# Patient Record
Sex: Male | Born: 1950 | Race: White | Hispanic: No | Marital: Married | Smoking: Never smoker
Health system: Southern US, Community
[De-identification: ages and names within clinical notes are randomized; demographics above are authoritative.]

## PROBLEM LIST (undated history)

## (undated) DIAGNOSIS — G4700 Insomnia, unspecified: Secondary | ICD-10-CM

## (undated) DIAGNOSIS — J189 Pneumonia, unspecified organism: Secondary | ICD-10-CM

## (undated) DIAGNOSIS — G478 Other sleep disorders: Secondary | ICD-10-CM

## (undated) DIAGNOSIS — K589 Irritable bowel syndrome without diarrhea: Secondary | ICD-10-CM

## (undated) DIAGNOSIS — E739 Lactose intolerance, unspecified: Secondary | ICD-10-CM

## (undated) DIAGNOSIS — L738 Other specified follicular disorders: Secondary | ICD-10-CM

## (undated) DIAGNOSIS — R490 Dysphonia: Secondary | ICD-10-CM

## (undated) DIAGNOSIS — L719 Rosacea, unspecified: Secondary | ICD-10-CM

## (undated) DIAGNOSIS — R7302 Impaired glucose tolerance (oral): Secondary | ICD-10-CM

## (undated) DIAGNOSIS — F32A Depression, unspecified: Secondary | ICD-10-CM

## (undated) DIAGNOSIS — E785 Hyperlipidemia, unspecified: Secondary | ICD-10-CM

## (undated) DIAGNOSIS — G4730 Sleep apnea, unspecified: Secondary | ICD-10-CM

## (undated) DIAGNOSIS — G4733 Obstructive sleep apnea (adult) (pediatric): Secondary | ICD-10-CM

## (undated) DIAGNOSIS — L219 Seborrheic dermatitis, unspecified: Secondary | ICD-10-CM

## (undated) DIAGNOSIS — Z86010 Personal history of colonic polyps: Principal | ICD-10-CM

## (undated) DIAGNOSIS — J309 Allergic rhinitis, unspecified: Secondary | ICD-10-CM

## (undated) DIAGNOSIS — T7840XA Allergy, unspecified, initial encounter: Secondary | ICD-10-CM

## (undated) DIAGNOSIS — G43909 Migraine, unspecified, not intractable, without status migrainosus: Secondary | ICD-10-CM

## (undated) DIAGNOSIS — B54 Unspecified malaria: Secondary | ICD-10-CM

## (undated) DIAGNOSIS — Z889 Allergy status to unspecified drugs, medicaments and biological substances status: Secondary | ICD-10-CM

## (undated) HISTORY — DX: Lactose intolerance, unspecified: E73.9

## (undated) HISTORY — DX: Rosacea, unspecified: L71.9

## (undated) HISTORY — DX: Personal history of colonic polyps: Z86.010

## (undated) HISTORY — PX: COLONOSCOPY: SHX174

## (undated) HISTORY — DX: Allergy status to unspecified drugs, medicaments and biological substances: Z88.9

## (undated) HISTORY — DX: Other sleep disorders: G47.8

## (undated) HISTORY — DX: Pneumonia, unspecified organism: J18.9

## (undated) HISTORY — DX: Allergy, unspecified, initial encounter: T78.40XA

## (undated) HISTORY — PX: VOCAL CORD INJECTION: SHX2663

## (undated) HISTORY — DX: Insomnia, unspecified: G47.00

## (undated) HISTORY — DX: Migraine, unspecified, not intractable, without status migrainosus: G43.909

## (undated) HISTORY — DX: Sleep apnea, unspecified: G47.30

## (undated) HISTORY — DX: Dysphonia: R49.0

## (undated) HISTORY — DX: Impaired glucose tolerance (oral): R73.02

## (undated) HISTORY — DX: Depression, unspecified: F32.A

## (undated) HISTORY — DX: Other specified follicular disorders: L73.8

## (undated) HISTORY — DX: Allergic rhinitis, unspecified: J30.9

## (undated) HISTORY — DX: Unspecified malaria: B54

## (undated) HISTORY — DX: Seborrheic dermatitis, unspecified: L21.9

## (undated) HISTORY — DX: Obstructive sleep apnea (adult) (pediatric): G47.33

## (undated) HISTORY — DX: Irritable bowel syndrome without diarrhea: K58.9

## (undated) HISTORY — DX: Hyperlipidemia, unspecified: E78.5

---

## 1972-06-23 HISTORY — PX: WISDOM TOOTH EXTRACTION: SHX21

## 1988-06-23 HISTORY — PX: VASECTOMY: SHX75

## 1997-06-23 DIAGNOSIS — B009 Herpesviral infection, unspecified: Secondary | ICD-10-CM

## 1997-06-23 HISTORY — DX: Herpesviral infection, unspecified: B00.9

## 2004-10-24 ENCOUNTER — Ambulatory Visit: Payer: Self-pay | Admitting: Internal Medicine

## 2005-06-23 DIAGNOSIS — K5790 Diverticulosis of intestine, part unspecified, without perforation or abscess without bleeding: Secondary | ICD-10-CM

## 2005-06-23 HISTORY — DX: Diverticulosis of intestine, part unspecified, without perforation or abscess without bleeding: K57.90

## 2005-08-22 ENCOUNTER — Encounter: Admission: RE | Admit: 2005-08-22 | Discharge: 2005-08-22 | Payer: Self-pay | Admitting: Family Medicine

## 2005-08-22 ENCOUNTER — Ambulatory Visit: Payer: Self-pay | Admitting: Internal Medicine

## 2005-09-05 ENCOUNTER — Ambulatory Visit: Payer: Self-pay | Admitting: Internal Medicine

## 2006-04-22 ENCOUNTER — Ambulatory Visit: Payer: Self-pay | Admitting: Internal Medicine

## 2008-06-23 DIAGNOSIS — H9190 Unspecified hearing loss, unspecified ear: Secondary | ICD-10-CM

## 2008-06-23 HISTORY — DX: Unspecified hearing loss, unspecified ear: H91.90

## 2010-09-11 ENCOUNTER — Encounter: Payer: Self-pay | Admitting: Internal Medicine

## 2010-09-19 NOTE — Letter (Signed)
Summary: Colonoscopy Date Change Letter  [facility]  [address] [place], [place] [address]   Phone: [phone]  Fax: [phone]      September 11, 2010 MRN: [id number]   Jesus Ward [address] [place], [place]  [address]   Dear [name],   Previously you were recommended to have a repeat colonoscopy around this time. Your chart was recently reviewed by Dr. Lina Sar of [facility]. Follow up colonoscopy is now recommended in March 2017. This revised recommendation is based on current, nationally recognized guidelines for colorectal cancer screening and polyp surveillance. These guidelines are endorsed by the American Cancer Society, [employer] on Colorectal Cancer as well as numerous other major medical organizations.  Please understand that our recommendation assumes that you do not have any new symptoms such as bleeding, a change in bowel habits, anemia, or significant abdominal discomfort. If you do have any concerning GI symptoms or want to discuss the guideline recommendations, please call to arrange an office visit at your earliest convenience. Otherwise we will keep you in our reminder system and contact you 1-2 months prior to the date listed above to schedule your next colonoscopy.  Thank you,  Hedwig Morton. Juanda Chance, M.D.  [facility] [phone]

## 2011-01-31 ENCOUNTER — Institutional Professional Consult (permissible substitution): Payer: Self-pay | Admitting: Internal Medicine

## 2011-03-07 ENCOUNTER — Ambulatory Visit (INDEPENDENT_AMBULATORY_CARE_PROVIDER_SITE_OTHER): Payer: BC Managed Care – PPO | Admitting: Internal Medicine

## 2011-03-07 ENCOUNTER — Encounter: Payer: Self-pay | Admitting: Internal Medicine

## 2011-03-07 VITALS — BP 120/80 | HR 68 | Ht 70.0 in | Wt 245.4 lb

## 2011-03-07 DIAGNOSIS — G4733 Obstructive sleep apnea (adult) (pediatric): Secondary | ICD-10-CM

## 2011-03-07 DIAGNOSIS — G4700 Insomnia, unspecified: Secondary | ICD-10-CM

## 2011-03-07 MED ORDER — ESZOPICLONE 2 MG PO TABS: 2.0000 mg | ORAL_TABLET | Freq: Every evening | ORAL | Status: DC | PRN

## 2011-03-07 NOTE — Assessment & Plan Note (Addendum)
Medical issues and treatment options for sleep apnea were reviewed. We discussed the impact of CPAP humidification on his speaking voice. Try advancing pressure to 10. Report if it aggravates dryness or voice issues

## 2011-03-07 NOTE — Patient Instructions (Signed)
Sample and script- try Lunesta 2 mg  At bedtime for sleep as an alternative to [place].   Order- [facility]- change CPAP to 10 cwp  Please call as needed

## 2011-03-07 NOTE — Assessment & Plan Note (Signed)
He is having sleep eating issues that may be the result of partial arousal/ sleep walking on [place].  We will try Lunesta but if the main issue is waking after sleep onset, then ambien CR is an option.

## 2011-03-07 NOTE — Progress Notes (Signed)
Subjective:    Patient ID: Jesus Ward, male    DOB: [birth year], 60 y.o.   MRN: [id number]  HPI 03/07/11- 71 yoM seen in sleep medicine consultation at kind request of Dr Duaine Dredge because of sleep apnea.  He was originally diagnosed in the 1990s with a baseline RDI/AHI of 53 per hour on 03/27/1994. He was fitted with CPAP and I last saw him in this office in 2006. Since then he has been using CPAP at 9 CWP from [facility] with a nasal mask but no humidifier. Uncomfortable dryness actually has triggered this current visit to me because it was interfering with his ability to sleep. 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 In particular he notices difficulty with sleep maintenance. Ambien helped and he has usually fall asleep quickly, but Ambien wears off too early and then he is awake off and on. He reports frequent sleep-eating. Bedtimes between 10 and 11 PM, estimated sleep latency 1-2 minutes,, out of bed once during the night for up at 7 to start his day. He has lost about 10 pounds in the last 2 years. 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  Review of Systems Constitutional:   No-   weight loss, night sweats, fevers, chills,  + fatigue, lassitude. HEENT:   No-  headaches, difficulty swallowing, tooth/dental problems, sore throat,       No-  sneezing, itching, ear ache, +nasal congestion, post nasal drip,  CV:  No-   chest pain, orthopnea, PND, swelling in lower extremities, anasarca,dizziness, palpitations Resp: No-   shortness of breath with exertion or at rest.              No-   productive cough,  No non-productive cough,  No-  coughing up of blood.              No-   change in color of mucus.  No- wheezing.   Skin: No-   rash or lesions. GI:  No-   heartburn, indigestion, abdominal pain, nausea, vomiting, diarrhea,             change in bowel habits, loss of appetite GU: No-   dysuria, change in color of urine, no urgency or frequency.  No- flank pain. MS:  No-   joint pain or swelling.  No- decreased range of motion.  No- back pain. Neuro- grossly normal to observation, Or:  Psych:  No- change in mood or affect. + depression/ anxiety.  No memory loss.      Objective:   Physical Exam General- Alert, Oriented, Affect-appropriate, Distress- none acute, overweight Skin- rash-none, lesions- none, excoriation- none Lymphadenopathy- none Head- atraumatic            Eyes- Gross vision intact, PERRLA, conjunctivae clear secretions            Ears- Hearing, canals-normal            Nose- Clear, no-Septal dev, mucus, polyps, erosion, perforation             Throat- Mallampati II , mucosa clear , drainage- none, tonsils- atrophic Neck- flexible , trachea midline, no stridor , thyroid nl, carotid no bruit Chest - symmetrical excursion , unlabored           Heart/CV- RRR , no murmur , no gallop  , no  rub, nl s1 s2                           - JVD- none , edema- none, stasis changes- none, varices- none           Lung- clear to P&A, wheeze- none, cough- none , dullness-none, rub- none           Chest wall-  Abd- tender-no, distended-no, bowel sounds-present, HSM- no Br/ Gen/ Rectal- Not done, not indicated Extrem- cyanosis- none, clubbing, none, atrophy- none, strength- nl Neuro- grossly intact to observation         Assessment & Plan:

## 2011-03-20 ENCOUNTER — Encounter: Payer: Self-pay | Admitting: Internal Medicine

## 2011-04-28 DIAGNOSIS — R490 Dysphonia: Secondary | ICD-10-CM | POA: Insufficient documentation

## 2011-05-28 DIAGNOSIS — Z8719 Personal history of other diseases of the digestive system: Secondary | ICD-10-CM | POA: Insufficient documentation

## 2011-05-28 DIAGNOSIS — J45909 Unspecified asthma, uncomplicated: Secondary | ICD-10-CM | POA: Insufficient documentation

## 2013-08-16 ENCOUNTER — Ambulatory Visit
Admission: RE | Admit: 2013-08-16 | Discharge: 2013-08-16 | Disposition: A | Discharge status: Home / Self Care | Payer: BC Managed Care – PPO | Source: Ambulatory Visit | Attending: Family Medicine | Admitting: Family Medicine

## 2013-08-16 ENCOUNTER — Other Ambulatory Visit: Payer: Self-pay | Admitting: Family Medicine

## 2013-08-16 DIAGNOSIS — R0781 Pleurodynia: Secondary | ICD-10-CM

## 2013-08-16 DIAGNOSIS — R059 Cough, unspecified: Secondary | ICD-10-CM

## 2013-08-16 DIAGNOSIS — R05 Cough: Secondary | ICD-10-CM

## 2013-09-01 ENCOUNTER — Other Ambulatory Visit: Payer: Self-pay | Admitting: Family Medicine

## 2013-09-01 ENCOUNTER — Ambulatory Visit
Admission: RE | Admit: 2013-09-01 | Discharge: 2013-09-01 | Disposition: A | Discharge status: Home / Self Care | Payer: BC Managed Care – PPO | Source: Ambulatory Visit | Attending: Family Medicine | Admitting: Family Medicine

## 2013-09-01 DIAGNOSIS — Z09 Encounter for follow-up examination after completed treatment for conditions other than malignant neoplasm: Secondary | ICD-10-CM

## 2013-09-27 ENCOUNTER — Ambulatory Visit
Admission: RE | Admit: 2013-09-27 | Discharge: 2013-09-27 | Disposition: A | Discharge status: Home / Self Care | Payer: BC Managed Care – PPO | Source: Ambulatory Visit | Attending: Family Medicine | Admitting: Family Medicine

## 2013-09-27 ENCOUNTER — Other Ambulatory Visit: Payer: Self-pay | Admitting: Family Medicine

## 2013-09-27 DIAGNOSIS — R0989 Other specified symptoms and signs involving the circulatory and respiratory systems: Secondary | ICD-10-CM

## 2013-09-27 DIAGNOSIS — R05 Cough: Secondary | ICD-10-CM

## 2013-09-27 DIAGNOSIS — R059 Cough, unspecified: Secondary | ICD-10-CM

## 2014-01-12 ENCOUNTER — Ambulatory Visit (INDEPENDENT_AMBULATORY_CARE_PROVIDER_SITE_OTHER): Payer: BC Managed Care – PPO | Admitting: Internal Medicine

## 2014-01-12 ENCOUNTER — Encounter: Payer: Self-pay | Admitting: Internal Medicine

## 2014-01-12 VITALS — BP 126/80 | HR 86 | Ht 70.0 in | Wt 241.0 lb

## 2014-01-12 DIAGNOSIS — G4733 Obstructive sleep apnea (adult) (pediatric): Secondary | ICD-10-CM

## 2014-01-12 NOTE — Patient Instructions (Signed)
Order- [facility] autotitrate CPAP 5-20 for pressure/ compliance recommendation     Dx OSA  Please call as needed

## 2014-01-12 NOTE — Progress Notes (Signed)
01/12/14- 62 yoM  FOLLOWS FOR:  Pt last seen 2012-Wearing CPAP 6-8 hours per night.  Wife reports that he is now snoring again NPSG 03/27/94- AHI 53/ hr, weight 225 lbs, CPAP titrated to 10. Has been wearing CPAP/ 10/ [facility] with nasal mask, all night every night. Prefers not to use a humidifier-simpler with travel. Machine is probably 61 years old, working ok.  Prior to Admission medications   Medication Sig Start Date End Date Taking? Authorizing Provider  albuterol (PROAIR HFA) 108 (90 BASE) MCG/ACT inhaler Inhale 2 puffs into the lungs every 6 (six) hours as needed for wheezing or shortness of breath.   Yes Historical Provider, MD  Cholecalciferol (VITAMIN D-3) 1000 UNITS CAPS Take 1 capsule by mouth daily.   Yes Historical Provider, MD  clonazePAM (KLONOPIN) 1 MG tablet Take 1 mg by mouth at bedtime.   Yes Historical Provider, MD  fluticasone (FLOVENT HFA) 110 MCG/ACT inhaler Inhale 2 puffs into the lungs 2 (two) times daily.   Yes Historical Provider, MD  loratadine (CLARITIN) 10 MG tablet Take 10 mg by mouth daily.   Yes Historical Provider, MD  simvastatin (ZOCOR) 40 MG tablet Take 40 mg by mouth daily.   Yes Historical Provider, MD  zolpidem (AMBIEN) 10 MG tablet Take 10 mg by mouth at bedtime as needed for sleep.   Yes Historical Provider, MD   Past Medical History  Diagnosis Date  . OSA (obstructive sleep apnea)   . Asthma   . Multiple allergies   . Hyperlipidemia   . Vitamin D deficiency    Past Surgical History  Procedure Laterality Date  . Wisdom tooth extraction  1974  . Vasectomy  1990   History reviewed. No pertinent family history. History   Social History  . Marital Status: Married    Spouse Name: N/A    Number of Children: 2  . Years of Education: N/A   Occupational History  . Senior [employer]    Social History Main Topics  . Smoking status: Never Smoker   . Smokeless tobacco: Never Used  . Alcohol Use: 0.5 oz/week    1 drink(s) per week  . Drug  Use: No  . Sexual Activity: Not on file   Other Topics Concern  . Not on file   Social History Narrative  . No narrative on file   ROS-see HPI Constitutional:   + weight gain,No- night sweats, fevers, chills, fatigue, lassitude. HEENT:   No-  headaches, difficulty swallowing, tooth/dental problems, sore throat,       No-  sneezing, itching, ear ache, nasal congestion, post nasal drip,  CV:  No-   chest pain, orthopnea, PND, swelling in lower extremities, anasarca,                                  dizziness, palpitations Resp: No-   shortness of breath with exertion or at rest.              No-   productive cough,  No non-productive cough,  No- coughing up of blood.              No-   change in color of mucus.  No- wheezing.   Skin: No-   rash or lesions. GI:  No-   heartburn, indigestion, abdominal pain, nausea, vomiting, diarrhea,                 change  in bowel habits, loss of appetite GU: No-   dysuria, change in color of urine, no urgency or frequency.  No- flank pain. MS:  No-   joint pain or swelling.  No- decreased range of motion.  No- back pain. Neuro-     nothing unusual Psych:  No- change in mood or affect. No depression or anxiety.  No memory loss.  OBJ- Physical Exam General- Alert, Oriented, Affect-appropriate, Distress- none acute, overweight Skin- rash-none, lesions- none, excoriation- none Lymphadenopathy- none Head- atraumatic            Eyes- Gross vision intact, PERRLA, conjunctivae and secretions clear            Ears- Hearing, canals-normal            Nose- Clear, no-Septal dev, mucus, polyps, erosion, perforation             Throat- Mallampati III , mucosa clear , drainage- none, tonsils- atrophic Neck- flexible , trachea midline, no stridor , thyroid nl, carotid no bruit Chest - symmetrical excursion , unlabored           Heart/CV- RRR , no murmur , no gallop  , no rub, nl s1 s2                           - JVD- none , edema- none, stasis changes- none,  varices- none           Lung- clear to P&A, wheeze- none, cough- none , dullness-none, rub- none           Chest wall-  Abd- tender-no, distended-no, bowel sounds-present, HSM- no Br/ Gen/ Rectal- Not done, not indicated Extrem- cyanosis- none, clubbing, none, atrophy- none, strength- nl Neuro- grossly intact to observation

## 2014-01-13 NOTE — Assessment & Plan Note (Signed)
Weight gain since original assessment, and now snoring through previously adequate pressure with good compliance. Plan- autotitrate for pressure reassessment

## 2014-03-28 ENCOUNTER — Telehealth: Payer: Self-pay | Admitting: Internal Medicine

## 2014-03-28 DIAGNOSIS — G4733 Obstructive sleep apnea (adult) (pediatric): Secondary | ICD-10-CM

## 2014-03-28 NOTE — Telephone Encounter (Signed)
lmomtcb x1 for pt 

## 2014-03-29 NOTE — Telephone Encounter (Signed)
Called [facility] and requested DL  Will await the fax

## 2014-03-29 NOTE — Telephone Encounter (Signed)
I am not finding a recent CPAP download report from [facility]. Please call for it.

## 2014-03-29 NOTE — Telephone Encounter (Signed)
Called and spoke to pt. Pt requesting recs made by CY based on pt's auto titration. Pt states he is still snoring a lot.    Pt seen on 01/12/2014 by CY:  Patient Instructions     Order- [facility] autotitrate CPAP 5-20 for pressure/ compliance recommendation Dx OSA  Please call as needed     CY please advise.

## 2014-03-30 NOTE — Telephone Encounter (Signed)
Called [facility] and spoke with [facility] and requested this to be faxed, AGAIN

## 2014-03-30 NOTE — Telephone Encounter (Signed)
Will forward to Ronda to f/u on download

## 2014-03-31 NOTE — Telephone Encounter (Signed)
Based on download- Order DME [facility]- change CPAP to fixed 14 and teach how to adjust Ramp for comfortl.

## 2014-03-31 NOTE — Telephone Encounter (Signed)
Called and spoke with pt and he is aware of order placed with [facility].  He is aware that they will contact him to set this up.

## 2014-03-31 NOTE — Telephone Encounter (Signed)
Results attached to phone note and placed on CY's cart.

## 2014-03-31 NOTE — Telephone Encounter (Signed)
Order placed.  lmtcb X1 to make pt aware.

## 2015-07-17 ENCOUNTER — Encounter: Payer: Self-pay | Admitting: Internal Medicine

## 2015-07-17 ENCOUNTER — Ambulatory Visit (INDEPENDENT_AMBULATORY_CARE_PROVIDER_SITE_OTHER): Payer: BLUE CROSS/BLUE SHIELD | Admitting: Internal Medicine

## 2015-07-17 VITALS — BP 136/88 | HR 68 | Ht 70.0 in | Wt 254.0 lb

## 2015-07-17 DIAGNOSIS — G4700 Insomnia, unspecified: Secondary | ICD-10-CM | POA: Diagnosis not present

## 2015-07-17 DIAGNOSIS — G4733 Obstructive sleep apnea (adult) (pediatric): Secondary | ICD-10-CM | POA: Diagnosis not present

## 2015-07-17 NOTE — Patient Instructions (Signed)
We can continue CPAP 10/ [facility]  Ok to keep current inhalers available as discussed  Please call if we can help

## 2015-07-17 NOTE — Progress Notes (Signed)
01/12/14- 62 yoM  FOLLOWS FOR:  Pt last seen 2012-Wearing CPAP 6-8 hours per night.  Wife reports that he is now snoring again NPSG 03/27/94- AHI 53/ hr, weight 225 lbs, CPAP titrated to 10. Has been wearing CPAP/ 10/ [facility] with nasal mask, all night every night. Prefers not to use a humidifier-simpler with travel. Machine is probably 63 years old, working ok.  07/17/2015-65 year old male followed for OSA, insomnia CPAP 10/[facility] FOLLOWS FOR: Wears CPAP nightly. Denies problems with mask or pressure. DME- [facility]. Still using Clonazepam qhs. Vaccinations maintained by his primary physician  ROS-see HPI  += pos Constitutional:   + weight gain,No- night sweats, fevers, chills, fatigue, lassitude. HEENT:   No-  headaches, difficulty swallowing, tooth/dental problems, sore throat,       No-  sneezing, itching, ear ache, nasal congestion, post nasal drip,  CV:  No-   chest pain, orthopnea, PND, swelling in lower extremities, anasarca,                                                    dizziness, palpitations Resp: No-   shortness of breath with exertion or at rest.              No-   productive cough,  No non-productive cough,  No- coughing up of blood.              No-   change in color of mucus.  No- wheezing.   Skin: No-   rash or lesions. GI:  No-   heartburn, indigestion, abdominal pain, nausea, vomiting, diarrhea,                 change in bowel habits, loss of appetite GU: No-   dysuria, change in color of urine, no urgency or frequency.  No- flank pain. MS:  No-   joint pain or swelling.  No- decreased range of motion.  No- back pain. Neuro-     nothing unusual Psych:  No- change in mood or affect. No depression or anxiety.  No memory loss.  OBJ- Physical Exam General- Alert, Oriented, Affect-appropriate, Distress- none acute, + overweight Skin- rash-none, lesions- none, excoriation- none Lymphadenopathy- none Head- atraumatic            Eyes- Gross vision intact, PERRLA, conjunctivae  and secretions clear            Ears- Hearing, canals-normal            Nose- Clear, no-Septal dev, mucus, polyps, erosion, perforation             Throat- Mallampati III , mucosa clear , drainage- none, tonsils- atrophic Neck- flexible , trachea midline, no stridor , thyroid nl, carotid no bruit Chest - symmetrical excursion , unlabored           Heart/CV- RRR , no murmur , no gallop  , no rub, nl s1 s2                           - JVD- none , edema- none, stasis changes- none, varices- none           Lung- clear to P&A, wheeze- none, cough- none , dullness-none, rub- none           Chest wall-  Abd- tender-no, distended-no,  bowel sounds-present, HSM- no Br/ Gen/ Rectal- Not done, not indicated Extrem- cyanosis- none, clubbing, none, atrophy- none, strength- nl Neuro- grossly intact to observation

## 2015-07-22 NOTE — Assessment & Plan Note (Signed)
Maintaining adequate sleep habits without concerns expressed

## 2015-07-22 NOTE — Assessment & Plan Note (Signed)
Compliance download indicates 100% of nights meet goals

## 2015-07-27 ENCOUNTER — Encounter: Payer: Self-pay | Admitting: Internal Medicine

## 2015-08-24 ENCOUNTER — Encounter: Payer: Self-pay | Admitting: Gastroenterology

## 2016-01-31 ENCOUNTER — Other Ambulatory Visit: Payer: Self-pay | Admitting: Family Medicine

## 2016-01-31 ENCOUNTER — Ambulatory Visit
Admission: RE | Admit: 2016-01-31 | Discharge: 2016-01-31 | Disposition: A | Discharge status: Home / Self Care | Payer: BLUE CROSS/BLUE SHIELD | Source: Ambulatory Visit | Attending: Family Medicine | Admitting: Family Medicine

## 2016-01-31 DIAGNOSIS — S8991XA Unspecified injury of right lower leg, initial encounter: Secondary | ICD-10-CM

## 2016-01-31 DIAGNOSIS — S99911A Unspecified injury of right ankle, initial encounter: Secondary | ICD-10-CM

## 2016-05-19 ENCOUNTER — Telehealth: Payer: Self-pay | Admitting: Internal Medicine

## 2016-05-19 NOTE — Telephone Encounter (Signed)
OK 

## 2016-05-20 NOTE — Telephone Encounter (Signed)
Called patient to schedule, No answer. Left a voicemail to call back & schedule Colon directly.

## 2016-05-21 ENCOUNTER — Other Ambulatory Visit: Payer: Self-pay | Admitting: Family Medicine

## 2016-05-21 DIAGNOSIS — R519 Headache, unspecified: Secondary | ICD-10-CM

## 2016-05-21 DIAGNOSIS — R51 Headache: Principal | ICD-10-CM

## 2016-05-26 ENCOUNTER — Encounter: Payer: Self-pay | Admitting: Internal Medicine

## 2016-06-02 ENCOUNTER — Ambulatory Visit
Admission: RE | Admit: 2016-06-02 | Discharge: 2016-06-02 | Disposition: A | Discharge status: Home / Self Care | Payer: BLUE CROSS/BLUE SHIELD | Source: Ambulatory Visit | Attending: Family Medicine | Admitting: Family Medicine

## 2016-06-02 DIAGNOSIS — R51 Headache: Principal | ICD-10-CM

## 2016-06-02 DIAGNOSIS — R519 Headache, unspecified: Secondary | ICD-10-CM

## 2016-06-02 MED ORDER — GADOBENATE DIMEGLUMINE 529 MG/ML IV SOLN
20.0000 mL | Freq: Once | INTRAVENOUS | Status: AC | PRN
  Administered 2016-06-02: 20 mL via INTRAVENOUS

## 2016-06-23 DIAGNOSIS — K635 Polyp of colon: Secondary | ICD-10-CM

## 2016-06-23 HISTORY — DX: Polyp of colon: K63.5

## 2016-07-28 ENCOUNTER — Ambulatory Visit
Admission: RE | Admit: 2016-07-28 | Discharge: 2016-07-28 | Disposition: A | Discharge status: Home / Self Care | Payer: BLUE CROSS/BLUE SHIELD | Source: Ambulatory Visit | Attending: Family Medicine | Admitting: Family Medicine

## 2016-07-28 ENCOUNTER — Other Ambulatory Visit: Payer: Self-pay | Admitting: Family Medicine

## 2016-07-28 DIAGNOSIS — R062 Wheezing: Secondary | ICD-10-CM

## 2016-07-28 DIAGNOSIS — R059 Cough, unspecified: Secondary | ICD-10-CM

## 2016-07-28 DIAGNOSIS — R0989 Other specified symptoms and signs involving the circulatory and respiratory systems: Secondary | ICD-10-CM

## 2016-07-28 DIAGNOSIS — R05 Cough: Secondary | ICD-10-CM

## 2016-07-30 ENCOUNTER — Ambulatory Visit (AMBULATORY_SURGERY_CENTER): Payer: Self-pay

## 2016-07-30 VITALS — Ht 70.0 in | Wt 237.8 lb

## 2016-07-30 DIAGNOSIS — Z1211 Encounter for screening for malignant neoplasm of colon: Secondary | ICD-10-CM

## 2016-07-30 NOTE — Progress Notes (Signed)
Patient denies allergies to eggs and soy. Patient is not on diet pills. Patient is not on home 02. Patient declined Emmi. Patient denies problems with anesthesia.

## 2016-07-31 ENCOUNTER — Encounter: Payer: Self-pay | Admitting: Internal Medicine

## 2016-08-13 ENCOUNTER — Encounter: Payer: Self-pay | Admitting: Internal Medicine

## 2016-08-13 ENCOUNTER — Ambulatory Visit (AMBULATORY_SURGERY_CENTER): Payer: BLUE CROSS/BLUE SHIELD | Admitting: Internal Medicine

## 2016-08-13 VITALS — BP 125/80 | HR 59 | Temp 98.2°F | Resp 11 | Ht 70.0 in | Wt 237.0 lb

## 2016-08-13 DIAGNOSIS — Z1211 Encounter for screening for malignant neoplasm of colon: Secondary | ICD-10-CM | POA: Diagnosis present

## 2016-08-13 DIAGNOSIS — D123 Benign neoplasm of transverse colon: Secondary | ICD-10-CM | POA: Diagnosis not present

## 2016-08-13 DIAGNOSIS — Z1212 Encounter for screening for malignant neoplasm of rectum: Secondary | ICD-10-CM | POA: Diagnosis not present

## 2016-08-13 MED ORDER — SODIUM CHLORIDE 0.9 % IV SOLN: 500.0000 mL | INTRAVENOUS | Status: DC

## 2016-08-13 NOTE — Patient Instructions (Addendum)
I found and removed one small colon polyp that looks benign.  You also have a condition called diverticulosis - common and not usually a problem. Please read the handout provided.  I will let you know pathology results and when to have another routine colonoscopy by mail.  I appreciate the opportunity to care for you. Gatha Mayer, MD, FACG   YOU HAD AN ENDOSCOPIC PROCEDURE TODAY AT [facility]:   Refer to the procedure report that was given to you for any specific questions about what was found during the examination.  If the procedure report does not answer your questions, please call your gastroenterologist to clarify.  If you requested that your care partner not be given the details of your procedure findings, then the procedure report has been included in a sealed envelope for you to review at your convenience later.  YOU SHOULD EXPECT: Some feelings of bloating in the abdomen. Passage of more gas than usual.  Walking can help get rid of the air that was put into your GI tract during the procedure and reduce the bloating. If you had a lower endoscopy (such as a colonoscopy or flexible sigmoidoscopy) you may notice spotting of blood in your stool or on the toilet paper. If you underwent a bowel prep for your procedure, you may not have a normal bowel movement for a few days.  Please Note:  You might notice some irritation and congestion in your nose or some drainage.  This is from the oxygen used during your procedure.  There is no need for concern and it should clear up in a day or so.  SYMPTOMS TO REPORT IMMEDIATELY:   Following lower endoscopy (colonoscopy or flexible sigmoidoscopy):  Excessive amounts of blood in the stool  Significant tenderness or worsening of abdominal pains  Swelling of the abdomen that is new, acute  Fever of 100F or higher    For urgent or emergent issues, a gastroenterologist can be reached at any hour by calling [phone]  [phone].   DIET:  We do recommend a small meal at first, but then you may proceed to your regular diet.  Drink plenty of fluids but you should avoid alcoholic beverages for 24 hours.  ACTIVITY:  You should plan to take it easy for the rest of today and you should NOT DRIVE or use heavy machinery until tomorrow (because of the sedation medicines used during the test).    FOLLOW UP: Our staff will call the number listed on your records the next business day following your procedure to check on you and address any questions or concerns that you may have regarding the information given to you following your procedure. If we do not reach you, we will leave a message.  However, if you are feeling well and you are not experiencing any problems, there is no need to return our call.  We will assume that you have returned to your regular daily activities without incident.  If any biopsies were taken you will be contacted by phone or by letter within the next 1-3 weeks.  Please call us at [phone] if you have not heard about the biopsies in 3 weeks.    SIGNATURES/CONFIDENTIALITY: You and/or your care partner have signed paperwork which will be entered into your electronic medical record.  These signatures attest to the fact that that the information above on your After Visit Summary has been reviewed and is understood.  Full responsibility of the confidentiality  of this discharge information lies with you and/or your care-partner.   Resume medications. Information given on polyps and diverticulosis.

## 2016-08-13 NOTE — Op Note (Addendum)
[facility] Patient Name: Jesus Ward Procedure Date: 08/13/2016 11:34 AM MRN: [id number] Endoscopist: Gatha Mayer , MD Age: 66 Referring MD:  Date of Birth: 1951-06-18 Gender: Male Account #: [id number] Procedure:                Colonoscopy Indications:              Screening for colorectal malignant neoplasm, Last                            colonoscopy: 2007 Medicines:                Monitored Anesthesia Care Procedure:                Pre-Anesthesia Assessment:                           - Prior to the procedure, a History and Physical                            was performed, and patient medications and                            allergies were reviewed. The patient's tolerance of                            previous anesthesia was also reviewed. The risks                            and benefits of the procedure and the sedation                            options and risks were discussed with the patient.                            All questions were answered, and informed consent                            was obtained. Prior Anticoagulants: The patient                            last took aspirin 1 day prior to the procedure. ASA                            Grade Assessment: II - A patient with mild systemic                            disease. After reviewing the risks and benefits,                            the patient was deemed in satisfactory condition to                            undergo the procedure.  After obtaining informed consent, the colonoscope                            was passed under direct vision. Throughout the                            procedure, the patient's blood pressure, pulse, and                            oxygen saturations were monitored continuously. The                            Model CF-HQ190L [id number] scope was introduced                            through the anus and advanced to the the cecum,                             identified by appendiceal orifice and ileocecal                            valve. The colonoscopy was performed without                            difficulty. The patient tolerated the procedure                            well. The quality of the bowel preparation was                            good. The bowel preparation used was Miralax. The                            ileocecal valve, appendiceal orifice, and rectum                            were photographed. Scope In: 11:38:52 AM Scope Out: [id number] Scope Withdrawal Time: 0 hours 13 minutes 58 seconds  Total Procedure Duration: 0 hours 17 minutes 49 seconds  Findings:                 The perianal and digital rectal examinations were                            normal. Pertinent negatives include normal prostate                            (size, shape, and consistency).                           A 7 mm polyp was found in the transverse colon. The                            polyp was sessile. The polyp was removed with a  cold snare. Resection and retrieval were complete.                            Verification of patient identification for the                            specimen was done. Estimated blood loss was minimal.                           Many small and large-mouthed diverticula were found                            in the left colon.                           The exam was otherwise without abnormality on                            direct and retroflexion views. Complications:            No immediate complications. Estimated Blood Loss:     Estimated blood loss was minimal. Impression:               - One 7 mm polyp in the transverse colon, removed                            with a cold snare. Resected and retrieved.                           - Diverticulosis in the left colon.                           - The examination was otherwise normal on direct                             and retroflexion views. Recommendation:           - Patient has a contact number available for                            emergencies. The signs and symptoms of potential                            delayed complications were discussed with the                            patient. Return to normal activities tomorrow.                            Written discharge instructions were provided to the                            patient.                           - Resume previous diet.                           -  Continue present medications.                           - Repeat colonoscopy is recommended. The                            colonoscopy date will be determined after pathology                            results from today's exam become available for                            review. Gatha Mayer, MD 08/13/2016 12:00:40 PM This report has been signed electronically. Addendum Number: 1   Addendum Date: 08/20/2016 4:30:52 PM      POLYP REMOVAL WAS COMPLETE - NOT INCOMPLETE AS STATED ABOVE Gatha Mayer, MD 08/20/2016 4:31:13 PM This report has been signed electronically. Addendum Number: 2   Addendum Date: 08/20/2016 4:32:55 PM      Error re: initial addendum - please disregard Gatha Mayer, MD 08/20/2016 4:33:14 PM This report has been signed electronically.

## 2016-08-13 NOTE — Progress Notes (Signed)
Called to room to assist during endoscopic procedure.  Patient ID and intended procedure confirmed with present staff. Received instructions for my participation in the procedure from the performing physician.  

## 2016-08-14 ENCOUNTER — Telehealth: Payer: Self-pay | Admitting: *Deleted

## 2016-08-14 NOTE — Telephone Encounter (Signed)
  Follow up Call-  Call back number 08/13/2016  Post procedure Call Back phone  # [phone]  Permission to leave phone message Yes  Some recent data might be hidden    [facility]

## 2016-08-15 ENCOUNTER — Telehealth: Payer: Self-pay

## 2016-08-15 NOTE — Telephone Encounter (Signed)
Call back post colonoscopy. No answer. Left message.

## 2016-08-20 ENCOUNTER — Encounter: Payer: Self-pay | Admitting: Internal Medicine

## 2016-08-20 DIAGNOSIS — Z860100 Personal history of colon polyps, unspecified: Secondary | ICD-10-CM

## 2016-08-20 HISTORY — DX: Personal history of colon polyps, unspecified: Z86.0100

## 2016-08-20 HISTORY — DX: Personal history of colonic polyps: Z86.010

## 2016-08-20 NOTE — Progress Notes (Signed)
Ssa/p 7 mm Recall 2023

## 2017-08-19 ENCOUNTER — Encounter (HOSPITAL_COMMUNITY): Payer: BLUE CROSS/BLUE SHIELD

## 2017-09-11 ENCOUNTER — Other Ambulatory Visit (HOSPITAL_COMMUNITY): Payer: Self-pay | Admitting: Family Medicine

## 2017-09-11 ENCOUNTER — Ambulatory Visit (HOSPITAL_COMMUNITY)
Admission: RE | Admit: 2017-09-11 | Discharge: 2017-09-11 | Disposition: A | Discharge status: Home / Self Care | Payer: 59 | Source: Ambulatory Visit | Attending: Vascular Surgery | Admitting: Vascular Surgery

## 2017-09-11 DIAGNOSIS — E785 Hyperlipidemia, unspecified: Secondary | ICD-10-CM | POA: Diagnosis not present

## 2017-09-11 DIAGNOSIS — R0989 Other specified symptoms and signs involving the circulatory and respiratory systems: Secondary | ICD-10-CM

## 2018-02-02 ENCOUNTER — Other Ambulatory Visit: Payer: Self-pay | Admitting: Family Medicine

## 2018-02-02 DIAGNOSIS — J329 Chronic sinusitis, unspecified: Secondary | ICD-10-CM

## 2018-02-02 DIAGNOSIS — K0889 Other specified disorders of teeth and supporting structures: Secondary | ICD-10-CM

## 2018-02-16 ENCOUNTER — Other Ambulatory Visit: Payer: Self-pay | Admitting: Family Medicine

## 2018-02-16 ENCOUNTER — Ambulatory Visit
Admission: RE | Admit: 2018-02-16 | Discharge: 2018-02-16 | Disposition: A | Discharge status: Home / Self Care | Payer: 59 | Source: Ambulatory Visit | Attending: Family Medicine | Admitting: Family Medicine

## 2018-02-16 DIAGNOSIS — M25512 Pain in left shoulder: Secondary | ICD-10-CM

## 2018-02-16 DIAGNOSIS — J329 Chronic sinusitis, unspecified: Secondary | ICD-10-CM

## 2018-02-16 DIAGNOSIS — K0889 Other specified disorders of teeth and supporting structures: Secondary | ICD-10-CM

## 2018-05-03 ENCOUNTER — Ambulatory Visit: Payer: 59 | Admitting: Internal Medicine

## 2018-05-12 ENCOUNTER — Telehealth: Payer: Self-pay | Admitting: Internal Medicine

## 2018-05-12 NOTE — Telephone Encounter (Signed)
Attempted to contact pt. I did not receive an answer. I have left a message for pt to return our call.  

## 2018-05-12 NOTE — Telephone Encounter (Signed)
Pt is calling back [phone]

## 2018-05-12 NOTE — Telephone Encounter (Signed)
Spoke with patient. Advised him that since he has not been seen since 2017, his insurance would require an OV in order for them to pay for his supplies. The original appt. Was scheduled for January 2020, patient did not want to wait that long. Was able to get patient scheduled for this Friday 11/22 at 9am with Dr. Annamaria Boots. Advised patient that we would place the order after his OV, he verbalized understanding.   Nothing further needed at time of call.

## 2018-05-14 ENCOUNTER — Ambulatory Visit (INDEPENDENT_AMBULATORY_CARE_PROVIDER_SITE_OTHER): Payer: 59 | Admitting: Internal Medicine

## 2018-05-14 ENCOUNTER — Encounter: Payer: Self-pay | Admitting: Internal Medicine

## 2018-05-14 VITALS — BP 124/80 | HR 66 | Ht 70.0 in | Wt 266.2 lb

## 2018-05-14 DIAGNOSIS — G4733 Obstructive sleep apnea (adult) (pediatric): Secondary | ICD-10-CM | POA: Diagnosis not present

## 2018-05-14 NOTE — Patient Instructions (Signed)
Order- [facility]    Please replace old CPAP machine, change to autopap 5-15, mask of choice, humidifier, supplies, [employer]View  Print script CPAP machine of choice, humidifier, supplies, mask of choice  Please call if we can help

## 2018-05-14 NOTE — Assessment & Plan Note (Signed)
He continues to benefit from CPAP and has been very compliant.  Equipment is now wearing out.  We discussed travel machines. Plan-replace old CPAP machine changing to AutoPap 5-15.  Also print prescription so that if he chooses, he can get a small travel machine.

## 2018-05-14 NOTE — Assessment & Plan Note (Signed)
He has had clonazepam available but is not describing significant difficulty initiating or maintaining sleep at this visit.

## 2018-05-14 NOTE — Progress Notes (Signed)
HPI Male never smoker followed for OSA, insomnia, complicated by asthma, GERD, hyperlipidemia NPSG 03/27/94- AHI 53/ hr, weight 225 lbs, CPAP titrated to 10. -------------------------------------------------------------------------------------------------  07/17/2015-67 year old male followed for OSA, insomnia CPAP 10/[facility] FOLLOWS FOR: Wears CPAP nightly. Denies problems with mask or pressure. DME- [facility]. Still using Clonazepam qhs. Vaccinations maintained by his primary physician  05/14/2018-67 year old male never smoker followed for OSA, insomnia, complicated by asthma, chronic sinusitis, GERD, hyperlipidemia CPAP 10/ [facility] -----Follows for: OSA, needs new mask, Doing well on CPAP but concerned about the mask not sealing tight due to wear and tear Body weight 266 pounds CPAP machine is too old to download with no chip.  Mask and headgear are worn out.  He uses it every night and takes it with him with his frequent travel. Denies significant active health problems for our attention otherwise.  ROS-see HPI  += positive Constitutional:   + weight gain,No- night sweats, fevers, chills, fatigue, lassitude. HEENT:   No-  headaches, difficulty swallowing, tooth/dental problems, sore throat,       No-  sneezing, itching, ear ache, nasal congestion, post nasal drip,  CV:  No-   chest pain, orthopnea, PND, swelling in lower extremities, anasarca,                                                    dizziness, palpitations Resp: No-   shortness of breath with exertion or at rest.              No-   productive cough,  No non-productive cough,  No- coughing up of blood.              No-   change in color of mucus.  No- wheezing.   Skin: No-   rash or lesions. GI:  No-   heartburn, indigestion, abdominal pain, nausea, vomiting, diarrhea,                 change in bowel habits, loss of appetite GU: No-   dysuria, change in color of urine, no urgency or frequency.  No- flank pain. MS:  No-   joint  pain or swelling.  No- decreased range of motion.  No- back pain. Neuro-     nothing unusual Psych:  No- change in mood or affect. No depression or anxiety.  No memory loss.  OBJ- Physical Exam General- Alert, Oriented, Affect-appropriate, Distress- none acute, + obese Skin- rash-none, lesions- none, excoriation- none Lymphadenopathy- none Head- atraumatic            Eyes- Gross vision intact, PERRLA, conjunctivae and secretions clear            Ears- Hearing, canals-normal            Nose- Clear, no-Septal dev, mucus, polyps, erosion, perforation             Throat- Mallampati III , mucosa clear , drainage- none, tonsils- atrophic Neck- flexible , trachea midline, no stridor , thyroid nl, carotid no bruit Chest - symmetrical excursion , unlabored           Heart/CV- RRR , no murmur , no gallop  , no rub, nl s1 s2                           - JVD-  none , edema- none, stasis changes- none, varices- none           Lung- clear to P&A, wheeze- none, cough- none , dullness-none, rub- none           Chest wall-  Abd- tender-no, distended-no, bowel sounds-present, HSM- no Br/ Gen/ Rectal- Not done, not indicated Extrem- cyanosis- none, clubbing, none, atrophy- none, strength- nl Neuro- grossly intact to observation

## 2018-06-11 ENCOUNTER — Other Ambulatory Visit: Payer: Self-pay | Admitting: Family Medicine

## 2018-06-11 ENCOUNTER — Ambulatory Visit
Admission: RE | Admit: 2018-06-11 | Discharge: 2018-06-11 | Disposition: A | Discharge status: Home / Self Care | Payer: 59 | Source: Ambulatory Visit | Attending: Family Medicine | Admitting: Family Medicine

## 2018-06-11 DIAGNOSIS — M25512 Pain in left shoulder: Secondary | ICD-10-CM

## 2018-06-30 ENCOUNTER — Telehealth: Payer: Self-pay | Admitting: Internal Medicine

## 2018-06-30 NOTE — Telephone Encounter (Signed)
Attempted to call Patient.  Left message on VM for Patient to call back about CPAP machine.

## 2018-06-30 NOTE — Telephone Encounter (Signed)
Called and spoke with patient, he stated that he received a new CPAP machine and he doesn't think the pressure is high enough. Patient stated that he is under less stress than before and he is also sleeping more so he is concerned that the CPAP isn't working. Patient also stated that this is a bigger CPAP and for his job he travels internationally and this will be hard to transport. He saw where [facility] has a mini CPAP and he would like to purchase this before his next business trip which is in February. CY please advise, thank you.

## 2018-06-30 NOTE — Telephone Encounter (Signed)
At last visit we had printed a script for CPAP machine of choice. We can reprint this and he can get it on line (eg from [employer])  Or he can contact his DME about getting it through them. Suggest either [employer] or [employer] as examples.   At last visit we also ordered through his DME to replace his old CPAP machine, changing to auto 5-15. If this was done, and pressure is not high enough, we can change the replacement order to auto 10-20.

## 2018-07-01 ENCOUNTER — Telehealth: Payer: Self-pay | Admitting: Internal Medicine

## 2018-07-01 DIAGNOSIS — G4733 Obstructive sleep apnea (adult) (pediatric): Secondary | ICD-10-CM

## 2018-07-01 NOTE — Telephone Encounter (Signed)
Please see 06/30/17 phone note.   Called and spoke to pt. Pt states he received new cpap machine in 04/2018 with settings of 5-15cm.  Pt stated that he does not feel that pressure if not strong enough, due waking not feeling well rested and fighting his mask in his sleep.   Pt has requested that CY review his compliance report to see if settings need to be adjusted.  Pt also states that he would like to come by our office to pick up Rx for travel cpap. Pt would like to wait for response from CY regarding settings before coming by to pick up Rx.  compliance report has been printed and given to CY.   CY please advise. Thanks

## 2018-07-01 NOTE — Telephone Encounter (Signed)
Current pressure settings are giving good control of apneas. I don't think increasing the range would make any difference, but no harm in trying if he wants to try changing to auto 5-20.

## 2018-07-01 NOTE — Telephone Encounter (Signed)
Pt is aware of below message and voiced his understanding. Pt would like to adjust cpap pressure. Order has been placed to [facility] to do so.  Rx for travel cpap has been printed and placed up front for pick up.  Pt is aware and voiced his understanding. Nothing further is needed.

## 2018-07-02 ENCOUNTER — Telehealth: Payer: Self-pay | Admitting: Internal Medicine

## 2018-07-02 NOTE — Telephone Encounter (Signed)
Spoke with patient. He was wanted to check on the status of the cpap pressure and RX for travel cpap. Advised patient that per the message from yesterday, the RX for his travel cpap had been placed up front yesterday.   He verbalized understanding. Nothing further needed at time of call.

## 2018-07-06 ENCOUNTER — Ambulatory Visit: Payer: 59 | Admitting: Internal Medicine

## 2018-07-12 NOTE — Progress Notes (Signed)
@Patient  ID: Jesus Ward, male    DOB: 1951-02-06, 68 y.o.   MRN: [id number]  Chief Complaint  Patient presents with  . Follow-up    Obstructive sleep apnea, CPAP follow-up    Referring provider: Derinda Late, MD  HPI:  68 year old male never smoker followed in our office for OSA, insomnia, Asthma  PMH: gerd, htn Smoker/ Smoking History: Never smoker Maintenance: None Pt of: Dr. Annamaria Boots  07/13/2018  - Visit   68 year old male never smoker presenting to our office today for annual CPAP follow-up.  Patient reports that CPAP use has been going fine.  Patient does report that he has had increased weight of about 16 pounds as well as increased work stress.  He wanted to get a new mask this required him to get a new CPAP as well.  He said since changing his mask he is had some facial irritation from the mask itself.  [facility] and they recommended putting a lotion/lubricant underneath the mask.  CPAP compliance report shows excellent compliance.  Compliance report: 30 in the last 30 days use, all 30 those days greater than 4 hours, average usage 8 hours and 19 minutes, APAP settings 5-20, AHI 0.8.  Patient does have questions regarding a new portable CPAP that he bought from his DME company [facility].  He wants to ensure that they have the correct settings and.  Patient also wants to ensure that the portable CPAP that he will use when he travels both [place] as well as internationally will also be in [place] for [place] to be able to monitor.     Tests:  NPSG 03/27/94- AHI 53/ hr, weight 225 lbs, CPAP titrated to 10.  FENO:  No results found for: NITRICOXIDE  PFT: No flowsheet data found.  Imaging: No results found.    Specialty Problems      Pulmonary Problems   Obstructive sleep apnea    NPSG 03/27/1994 RDI/AHI 53 per hour. Weight was 225 pounds. CPAP [facility]-auto titration download 02/2011> 10 cwp      Asthma      Allergies    Allergen Reactions  . Penicillins     Unknown- had reaction as a child    Immunization History  Administered Date(s) Administered  . Influenza Split 03/23/2013  . Influenza Whole 06/22/2018    Past Medical History:  Diagnosis Date  . Asthma   . Hx of colonic polyp 08/20/2016  . Hyperlipidemia   . Multiple allergies   . OSA (obstructive sleep apnea)   . Sleep apnea    CPAP   . Vitamin D deficiency     Tobacco History: Social History   Tobacco Use  Smoking Status Never Smoker  Smokeless Tobacco Never Used   Counseling given: Yes  Continue to not smoke  Outpatient Encounter Medications as of 07/13/2018  Medication Sig  . albuterol (PROAIR HFA) 108 (90 Base) MCG/ACT inhaler Inhale into the lungs every 6 (six) hours as needed for wheezing or shortness of breath.  . cetirizine (ZYRTEC) 10 MG tablet Take 10 mg by mouth daily.  . Cholecalciferol (VITAMIN D-3) 1000 UNITS CAPS Take 2 capsules by mouth daily.   . clonazePAM (KLONOPIN) 2 MG tablet Take 2 mg by mouth at bedtime as needed.  . Fluticasone-Salmeterol (ADVAIR) 250-50 MCG/DOSE AEPB Inhale 1 puff into the lungs 2 (two) times daily as needed.  . hydrocortisone 2.5 % cream Apply topically 2 (two) times daily.  . simvastatin (  ZOCOR) 40 MG tablet Take 40 mg by mouth daily.  Marland Kitchen topiramate (TOPAMAX) 50 MG tablet Take 50 mg by mouth at bedtime.  . tretinoin (RETIN-A) 0.1 % cream Apply topically at bedtime.  Marland Kitchen aspirin 81 MG tablet Take 81 mg by mouth daily.  Marland Kitchen omeprazole (PRILOSEC) 40 MG capsule Take 40 mg by mouth 2 (two) times daily.  . [DISCONTINUED] predniSONE (DELTASONE) 10 MG tablet Take 10 mg by mouth 1 day or 1 dose. Finishing off tapering dose   Facility-Administered Encounter Medications as of 07/13/2018  Medication  . 0.9 %  sodium chloride infusion     Review of Systems  Review of Systems  Constitutional: Negative for activity change, chills, fatigue, fever and unexpected weight change.  HENT: Negative for  postnasal drip, rhinorrhea and sore throat.   Eyes: Negative.   Respiratory: Negative for cough, shortness of breath and wheezing.   Cardiovascular: Negative for chest pain and palpitations.  Gastrointestinal: Negative for constipation, diarrhea, nausea and vomiting.  Endocrine: Negative.   Musculoskeletal: Negative.   Skin: Negative.        Slight facial irritation from CPAP mask  Neurological: Negative for dizziness and headaches.  Psychiatric/Behavioral: Negative.  Negative for dysphoric mood. The patient is not nervous/anxious.   All other systems reviewed and are negative.    Physical Exam  BP 132/76 (BP Location: Left Arm, Cuff Size: Normal)   Pulse 88   Ht 5\' 9"  (1.753 m)   Wt 267 lb (121.1 kg)   SpO2 95%   BMI 39.43 kg/m   Wt Readings from Last 5 Encounters:  07/13/18 267 lb (121.1 kg)  05/14/18 266 lb 3.2 oz (120.7 kg)  08/13/16 237 lb (107.5 kg)  07/30/16 237 lb 12.8 oz (107.9 kg)  07/17/15 254 lb (115.2 kg)    Physical Exam  Constitutional: He is oriented to person, place, and time and well-developed, well-nourished, and in no distress. No distress.  HENT:  Head: Normocephalic and atraumatic.  Right Ear: Hearing, tympanic membrane, external ear and ear canal normal.  Left Ear: Hearing, tympanic membrane, external ear and ear canal normal.  Nose: Nose normal.    Mouth/Throat: Uvula is midline and oropharynx is clear and moist. No oropharyngeal exudate.  + Mallampati 4  Eyes: Pupils are equal, round, and reactive to light.  Neck: Normal range of motion. Neck supple.  Cardiovascular: Normal rate, regular rhythm and normal heart sounds.  Pulmonary/Chest: Effort normal and breath sounds normal. No accessory muscle usage. No respiratory distress. He has no decreased breath sounds. He has no wheezes. He has no rhonchi.  Musculoskeletal: Normal range of motion.        General: No edema.  Lymphadenopathy:    He has no cervical adenopathy.  Neurological: He is  alert and oriented to person, place, and time. Gait normal.  Skin: Skin is warm, dry and intact. He is not diaphoretic. There is erythema.     Psychiatric: Mood, memory, affect and judgment normal.  Nursing note and vitals reviewed.     Lab Results:  CBC No results found for: WBC, RBC, HGB, HCT, PLT, MCV, MCH, MCHC, RDW, LYMPHSABS, MONOABS, EOSABS, BASOSABS  BMET No results found for: NA, K, CL, CO2, GLUCOSE, BUN, CREATININE, CALCIUM, GFRNONAA, GFRAA  BNP No results found for: BNP  ProBNP No results found for: PROBNP    Assessment & Plan:    Obstructive sleep apnea Assessment: Well managed on CPAP at this time Slight skin irritation from new mask CPAP compliance  report shows excellent compliance  Plan: -Continue CPAP use as ordered -Will order mask fitting with Lynnae Sandhoff in the sleep lab - pt to call and schedule at his request -Pt to contact DME company about portable CPAP and humidifier use  -Contacted D[facility] to ensure that portable CPAP is the correct settings as well as added to [place] - they requested patient contact  -Follow-up in 1 year or sooner if having issues using your CPAP    Lauraine Rinne, NP 07/13/2018   This appointment was 35 min long with over 50% of the time in direct face-to-face patient care, assessment, plan of care, and follow-up.

## 2018-07-13 ENCOUNTER — Encounter: Payer: Self-pay | Admitting: Pulmonary Disease

## 2018-07-13 ENCOUNTER — Ambulatory Visit (INDEPENDENT_AMBULATORY_CARE_PROVIDER_SITE_OTHER): Payer: 59 | Admitting: Pulmonary Disease

## 2018-07-13 VITALS — BP 132/76 | HR 88 | Ht 69.0 in | Wt 267.0 lb

## 2018-07-13 DIAGNOSIS — G4733 Obstructive sleep apnea (adult) (pediatric): Secondary | ICD-10-CM | POA: Diagnosis not present

## 2018-07-13 NOTE — Assessment & Plan Note (Addendum)
Assessment: Well managed on CPAP at this time Slight skin irritation from new mask CPAP compliance report shows excellent compliance  Plan: -Continue CPAP use as ordered -Will order mask fitting with Lynnae Sandhoff in the sleep lab - pt to call and schedule at his request -Pt to contact DME company about portable CPAP and humidifier use  -Contacted D[facility] to ensure that portable CPAP is the correct settings as well as added to [place] - they requested patient contact  -Follow-up in 1 year or sooner if having issues using your CPAP

## 2018-07-13 NOTE — Patient Instructions (Addendum)
Order for mask fitting with Lynnae Sandhoff  >>>call to schedule: [phone]    ([facility])  You need to contact [facility]  >>> Jason: [phone] >>>Regarding question of humidifier and mobile CPAP   We recommend that you continue using your CPAP daily >>>Keep up the hard work using your device >>> Goal should be wearing this for the entire night that you are sleeping, at least 4 to 6 hours  Remember:  . Do not drive or operate heavy machinery if tired or drowsy.  . Please notify the supply company and office if you are unable to use your device regularly due to missing supplies or machine being broken.  . Work on maintaining a healthy weight and following your recommended nutrition plan  . Maintain proper daily exercise and movement  . Maintaining proper use of your device can also help improve management of other chronic illnesses such as: Blood pressure, blood sugars, and weight management.   BiPAP/ CPAP Cleaning:  >>>Clean weekly, with Dawn soap, and bottle brush.  Set up to air dry.  We have contacted your DME company [facility] regarding your portable CPAP We have added to [place] so that we can monitor your compliance when you use it We have also requested a double check of their settings.   Follow up in 1 year or sooner if needed    It is flu season:   >>>Remember to be washing your hands regularly, using hand sanitizer, be careful to use around herself with has contact with people who are sick will increase her chances of getting sick yourself. >>> Best ways to protect herself from the flu: Receive the yearly flu vaccine, practice good hand hygiene washing with soap and also using hand sanitizer when available, eat a nutritious meals, get adequate rest, hydrate appropriately   Please contact the office if your symptoms worsen or you have concerns that you are not improving.   Thank you for choosing [facility] for your healthcare, and  for allowing [place] to partner with you on your healthcare journey. I am thankful to be able to provide care to you today.   Wyn Quaker FNP-C    CPAP and BPAP Information CPAP and BPAP are methods of helping a person breathe with the use of air pressure. CPAP stands for "continuous positive airway pressure." BPAP stands for "bi-level positive airway pressure." In both methods, air is blown through your nose or mouth and into your air passages to help you breathe well. CPAP and BPAP use different amounts of pressure to blow air. With CPAP, the amount of pressure stays the same while you breathe in and out. With BPAP, the amount of pressure is increased when you breathe in (inhale) so that you can take larger breaths. Your health care provider will recommend whether CPAP or BPAP would be more helpful for you. Why are CPAP and BPAP treatments used? CPAP or BPAP can be helpful if you have:  Sleep apnea.  Chronic obstructive pulmonary disease (COPD).  Heart failure.  Medical conditions that weaken the muscles of the chest including muscular dystrophy, or neurological diseases such as amyotrophic lateral sclerosis (ALS).  Other problems that cause breathing to be weak, abnormal, or difficult. CPAP is most commonly used for obstructive sleep apnea (OSA) to keep the airways from collapsing when the muscles relax during sleep. How is CPAP or BPAP administered? Both CPAP and BPAP are provided by a  small machine with a flexible plastic tube that attaches to a plastic mask. You wear the mask. Air is blown through the mask into your nose or mouth. The amount of pressure that is used to blow the air can be adjusted on the machine. Your health care provider will determine the pressure setting that should be used based on your individual needs. When should CPAP or BPAP be used? In most cases, the mask only needs to be worn during sleep. Generally, the mask needs to be worn throughout the night and during any  daytime naps. People with certain medical conditions may also need to wear the mask at other times when they are awake. Follow instructions from your health care provider about when to use the machine. What are some tips for using the mask?   Because the mask needs to be snug, some people feel trapped or closed-in (claustrophobic) when first using the mask. If you feel this way, you may need to get used to the mask. One way to do this is by holding the mask loosely over your nose or mouth and then gradually applying the mask more snugly. You can also gradually increase the amount of time that you use the mask.  Masks are available in various types and sizes. Some fit over your mouth and nose while others fit over just your nose. If your mask does not fit well, talk with your health care provider about getting a different one.  If you are using a mask that fits over your nose and you tend to breathe through your mouth, a chin strap may be applied to help keep your mouth closed.  The CPAP and BPAP machines have alarms that may sound if the mask comes off or develops a leak.  If you have trouble with the mask, it is very important that you talk with your health care provider about finding a way to make the mask easier to tolerate. Do not stop using the mask. Stopping the use of the mask could have a negative impact on your health. What are some tips for using the machine?  Place your CPAP or BPAP machine on a secure table or stand near an electrical outlet.  Know where the on/off switch is located on the machine.  Follow instructions from your health care provider about how to set the pressure on your machine and when you should use it.  Do not eat or drink while the CPAP or BPAP machine is on. Food or fluids could get pushed into your lungs by the pressure of the CPAP or BPAP.  Do not smoke. Tobacco smoke residue can damage the machine.  For home use, CPAP and BPAP machines can be rented or  purchased through home health care companies. Many different brands of machines are available. Renting a machine before purchasing may help you find out which particular machine works well for you.  Keep the CPAP or BPAP machine and attachments clean. Ask your health care provider for specific instructions. Get help right away if:  You have redness or open areas around your nose or mouth where the mask fits.  You have trouble using the CPAP or BPAP machine.  You cannot tolerate wearing the CPAP or BPAP mask.  You have pain, discomfort, and bloating in your abdomen. Summary  CPAP and BPAP are methods of helping a person breathe with the use of air pressure.  Both CPAP and BPAP are provided by a small machine with a flexible  plastic tube that attaches to a plastic mask.  If you have trouble with the mask, it is very important that you talk with your health care provider about finding a way to make the mask easier to tolerate. This information is not intended to replace advice given to you by your health care provider. Make sure you discuss any questions you have with your health care provider. Document Released: 03/07/2004 Document Revised: 02/09/2018 Document Reviewed: 04/28/2016 Elsevier Interactive Patient Education  2019 [employer].

## 2018-08-17 ENCOUNTER — Telehealth: Payer: Self-pay | Admitting: Internal Medicine

## 2018-08-17 ENCOUNTER — Other Ambulatory Visit (HOSPITAL_BASED_OUTPATIENT_CLINIC_OR_DEPARTMENT_OTHER): Payer: 59

## 2018-08-17 NOTE — Telephone Encounter (Signed)
Pt came into office and states he has an appt with the sleep center for a mask fitting. Pt states he does not need a mask fitting, he wants to keep his mask, he needs a new reservoir for his humidifier for his CPAP as his has a burn mark in the bottom from letting the water run dry. Called [facility] and spoke with Sonia Baller and she states pt can come into retail store and purchase one for $32. I informed pt, he cancelled the mask fitting and was going to go to [facility]. Pt denied needing anything further. Will sign off.

## 2018-12-26 ENCOUNTER — Encounter: Payer: Self-pay | Admitting: Internal Medicine

## 2018-12-27 ENCOUNTER — Telehealth: Payer: Self-pay | Admitting: Internal Medicine

## 2018-12-27 NOTE — Telephone Encounter (Signed)
LMTCB for pt.  45 day DL has been printed and placed at [place] in C pod. Raquel Sarna has been informed of the DL.

## 2018-12-28 NOTE — Telephone Encounter (Signed)
LMTCB x2 for pt 

## 2018-12-28 NOTE — Telephone Encounter (Signed)
The download only starts recording as of June 14 until July 5. During that time it shows adequate use each night and excellent control. Any tiredness during this time is from some other issue.

## 2018-12-28 NOTE — Telephone Encounter (Signed)
Patient is returning phone call.  Patient phone number is [phone].

## 2018-12-28 NOTE — Telephone Encounter (Signed)
Called and spoke with pt letting him know that CY reviewed the download and per the info we have so far, it shows excellent control with CPAP. Stated to pt due to this, if he is still having problems with fatigue, it is due to some other issue which he might need to discuss with PCP to see if any labwork might need to be done and pt verbalized understanding. Nothing further needed.

## 2018-12-28 NOTE — Telephone Encounter (Signed)
Call made to patient, made aware his cpap report has been given to CY/nurse for review and we will call him back with recommendations once we get a response.   CY please advise. Thanks.

## 2019-01-07 ENCOUNTER — Telehealth: Payer: Self-pay | Admitting: Internal Medicine

## 2019-01-07 NOTE — Telephone Encounter (Signed)
Called & spoke w/ Judeen Hammans with Dr. Cicero Duck office, [facility]. Judeen Hammans requested a summary of pt's last CPAP reading. [facility] does not have Epic, so they are unable to access pt's MAR. I let her know per protocol we would need patient's authorization before releasing records to another practice. I gave her the fax number to medical records [phone]. Judeen Hammans verbalized understanding and stated she would send a request to that fax number. Nothing further needed at this time.

## 2019-02-11 ENCOUNTER — Encounter: Payer: Self-pay | Admitting: Internal Medicine

## 2019-03-04 ENCOUNTER — Encounter: Payer: Self-pay | Admitting: Internal Medicine

## 2019-03-15 ENCOUNTER — Encounter: Payer: Self-pay | Admitting: Internal Medicine

## 2019-04-26 ENCOUNTER — Encounter: Payer: Self-pay | Admitting: Gastroenterology

## 2019-04-29 ENCOUNTER — Ambulatory Visit (INDEPENDENT_AMBULATORY_CARE_PROVIDER_SITE_OTHER): Payer: 59 | Admitting: Internal Medicine

## 2019-04-29 ENCOUNTER — Other Ambulatory Visit (INDEPENDENT_AMBULATORY_CARE_PROVIDER_SITE_OTHER): Payer: 59

## 2019-04-29 ENCOUNTER — Encounter: Payer: Self-pay | Admitting: Internal Medicine

## 2019-04-29 VITALS — BP 137/77 | HR 84 | Temp 98.0°F | Ht 70.0 in | Wt 267.0 lb

## 2019-04-29 DIAGNOSIS — Z6838 Body mass index (BMI) 38.0-38.9, adult: Secondary | ICD-10-CM

## 2019-04-29 DIAGNOSIS — R748 Abnormal levels of other serum enzymes: Secondary | ICD-10-CM | POA: Diagnosis not present

## 2019-04-29 DIAGNOSIS — E66812 Obesity, class 2: Secondary | ICD-10-CM

## 2019-04-29 LAB — HEPATIC FUNCTION PANEL
ALT: 33 U/L (ref 0–53)
AST: 18 U/L (ref 0–37)
Albumin: 4.5 g/dL (ref 3.5–5.2)
Alkaline Phosphatase: 48 U/L (ref 39–117)
Bilirubin, Direct: 0.1 mg/dL (ref 0.0–0.3)
Total Bilirubin: 0.5 mg/dL (ref 0.2–1.2)
Total Protein: 7.4 g/dL (ref 6.0–8.3)

## 2019-04-29 LAB — IGA: IgA: 221 mg/dL (ref 68–378)

## 2019-04-29 NOTE — Progress Notes (Addendum)
Jesus Ward 68 y.o. 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  The autoimmune positivity does raise the chance of some type of autoimmune liver disease as well.  We had a discussion about this today.  Plan for complete abdominal ultrasound first.  I told him that liver biopsy was ultimately the best way to understand what is going on with his liver.  He wants to think about that.  Should he have autoimmune disease then he might need treatment with immunosuppressants.  Again I think that is unlikely but we just do not know so a liver biopsy would be helpful.  Work-up as below.  Celiac disease is an outside possibility.  We will check his immunity status to hepatitis a and B as he might need vaccinations.  Nutrition discussion undertaken and advice given.  See obesity  Orders Placed This Encounter  Procedures  . US Abdomen Complete  . Hepatic function panel  . Tissue transglutaminase, IgA  . IgA  . Hepatitis A antibody, total  . Hepatitis B surface antibody  . Hepatitis B Surface AntiGEN     Class 2 severe obesity due to excess calories with serious comorbidity and body mass index (BMI) of 38.0 to 38.9 in adult [facility]) We had a good conversation about how it is difficult to lose weight.  I explained that my philosophy is most diets are to cumbersome and caloric restriction is problematic.  I think it is better to change what 1 eats and then can sitter working on intermittent fasting regimen.  This man sounds very busy and committed to his career and will eventually has some choices to make about how to prioritize eating and nutrition and how it can fit in with his work and life.  We did not really get into that today but my sense is that is a big issue for him.  Nevertheless I gave him information about www.gaplesinstitute.org, dietdoctor.com and drberry.com  I think he may do well with a  low-carb diet and lose weight and improve his metabolic status.     Subjective:   Chief Complaint: Abnormal transaminases  HPI This is a 68 year old married man referred back by Dr. Sandi Mariscal because of elevated liver enzymes.  Off and on for a couple of years, in July his ALT was 52 lipase was high as well though a repeat amylase and lipase were normal in August.  He ended up having a positive ANA negative antidouble-stranded DNA, ferritin of 494 with mildly elevated iron saturation and an elevated smooth muscle IgG antibody.  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  Negative ceruloplasmin alpha-1 antitrypsin.  Hepatitis C antibody is negative.  Mitochondrial antibodies negative.  Hepatitis B core antibody negative.  The titer on the ANA was 1-40.  The level of the anti-smooth muscle antibody was 31 normal less than 20.  He has been gaining weight.  Stressed eating.  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  He has hyperlipidemia and impaired glucose tolerance.  Also sleep apnea on CPAP.  He has struggled to lose weight.  Very difficult due to his schedule he says.  No imaging to date though Dr. Sandi Mariscal appropriately consider that he wanted to wait until the patient saw me.  Patient reports that he does not really  believe in BMI, he has been a weightlifter so he thinks his BMI is exaggerated.  Because he has been busy with work he has not been able to exercise and schedule eating properly.  Dr. Sandi Mariscal lists a nocturnal eating disorder since 2013 and his past medical history.  Colonoscopy by me in 2018 7 mm sessile serrated polyp with plans for colonoscopy in 2023.  Additional information platelets normal at 182 in December 2019. Allergies  Allergen Reactions  . Ceftin [Cefuroxime Axetil] Nausea Only  . Penicillins     Unknown- had reaction as a child   Current Meds   Medication Sig  . albuterol (PROAIR HFA) 108 (90 Base) MCG/ACT inhaler Inhale into the lungs every 6 (six) hours as needed for wheezing or shortness of breath.  Marland Kitchen aspirin 81 MG tablet Take 81 mg by mouth daily.  . cetirizine (ZYRTEC) 10 MG tablet Take 10 mg by mouth daily.  . Cholecalciferol (VITAMIN D-3) 1000 UNITS CAPS Take 2 capsules by mouth daily.   . clonazePAM (KLONOPIN) 2 MG tablet Take 2 mg by mouth at bedtime as needed.  . Fluticasone-Salmeterol (ADVAIR) 250-50 MCG/DOSE AEPB Inhale 1 puff into the lungs 2 (two) times daily as needed.  . hydrocortisone 2.5 % cream Apply topically 2 (two) times daily.  . simvastatin (ZOCOR) 40 MG tablet Take 40 mg by mouth daily.  Marland Kitchen testosterone (ANDROGEL) 50 MG/5GM (1%) GEL Place 5 g onto the skin daily.  Marland Kitchen topiramate (TOPAMAX) 50 MG tablet Take 50 mg by mouth at bedtime.  . tretinoin (RETIN-A) 0.1 % cream Apply topically at bedtime.   Current Facility-Administered Medications for the 04/29/19 encounter (Office Visit) with Gatha Mayer, MD  Medication  . 0.9 %  sodium chloride infusion   Past Medical History:  Diagnosis Date  . Allergic rhinitis   . Asthma   . Diverticulosis 2007  . 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 Hearing loss 2010  . HSV-1 (herpes simplex virus 1) infection 1999  . Hx of colonic polyp 08/20/2016  . Hyperlipidemia   . IBS (irritable bowel syndrome)   . Impaired glucose tolerance   . Insomnia   . Lactose intolerance   . Malaria    1974, 1975  . Migraine   . Multiple allergies   . Nocturnal sleep-related eating disorder   . OSA (obstructive sleep apnea)   . Pneumonia [id number]  . Rosacea   . Sebaceous hyperplasia   . Seborrheic dermatitis   . Serrated polyp of colon 2018  . Sleep apnea    CPAP   . Vitamin D deficiency    Past Surgical History:  Procedure Laterality Date  . VASECTOMY  1990  . VOCAL CORD INJECTION    . [place] EXTRACTION  1974   Social History    Social History Narrative  . Not on file   family history includes Alcohol abuse in his father; Anxiety disorder in his daughter; Depression in his father; OCD in his daughter; Other in his father and son; Panic disorder in his daughter; Schizophrenia in his mother.   Review of Systems As per HPI  Objective:   Physical Exam BP 137/77   Pulse 84   Temp 98 F (36.7 C)   Ht 5\' 10"  (1.778 m)   Wt 267 lb (121.1 kg)   SpO2 99%   BMI 38.31 kg/m  Obese white man in no acute distress Protuberant increased abdominal girth Eyes anicteric  Appropriate mood and affect  Data reviewed includes labs from 2019 going forward and office notes from 2019 going forward from Dr. Sandi Mariscal.

## 2019-04-29 NOTE — Patient Instructions (Addendum)
Your provider has requested that you go to the basement level for lab work before leaving today. Press "B" on the elevator. The lab is located at the first door on the left as you exit the elevator.   You have been scheduled for an abdominal ultrasound at [facility] on 05/11/2019 at 8:00AM. Please arrive 97minutes prior to your appointment for registration. Make certain not to have anything to eat or drink 6 hours prior to your appointment. Should you need to reschedule your appointment, please contact radiology at [phone]. This test typically takes about 30 minutes to perform.  Nutrition advice  Healthy and nutritious eating and weight loss are made to be harder than they need to be. A simplified diet approach based around eating normally, as much as you want without restricting intake too much is better, I think. You must avoid packaged foods and try to eat real foods as much as possible. Packaged foods, sugary sodas, highly processed foods taste great but are slow poisons that lead to obesity and/or poor health.  It is very helpful to take some time each week and plan your meals. Work with your spouse, partner, family to do this as much as possible. Preparing meals ahead to take to work or school is especially helpful and will save money, too. You can do this and by working together it can take less time.  Some resources that I like are:  www.gaplesinstitute.org (Do the learning modules about healthy eating)  www.dietdoctor.com - helps with low carb diets and also can learn about and consider intermittent  fasting. If you have diabetes would not do intermittent fasting without checking with your doctor. Best to change what you eat before doing this.  Www.drberry.com   I recommend you check your blood sugar daily and keep a log. If you are diabetic and check sugars.  Here are some guidelines to help you with meal planning -  Avoid all processed and packaged foods (bread, pasta,  crackers, chips, etc) and beverages containing calories.  Avoid added sugars and excessive natural sugars.  Attention to how you feel if you consume artificial sweeteners.  Do they make you more hungry or raise your blood sugar?  With every meal and snack, aim to get 20 g of protein (3 ounces of meat, 4 ounces of fish, 3 eggs, protein powder, 1 cup [place] yogurt, 1 cup cottage cheese, etc.)  Increase fiber in the form of non-starchy vegetables.  These help you feel full with very little carbohydrates and are good for gut health.  Eat 1 serving healthy carb per meal- 1/2 cup brown rice, beans, potato, corn- pay attention to whether or not this significantly raises your blood sugar if you are checking blood sugars. If it does, reduce the frequency you consume these.   Eat 2-3 servings of lower sugar fruits daily.  This includes berries, apples, oranges, peaches, pears, one half banana.  Have small amounts of good fats such as avocado, nuts, olive oil, nut butters, olives.  Add a little cheese to your salads to make them tasty.    I appreciate the opportunity to care for you. Silvano Rusk, MD, [facility]

## 2019-04-29 NOTE — Assessment & Plan Note (Signed)
We had a good conversation about how it is difficult to lose weight.  I explained that my philosophy is most diets are to cumbersome and caloric restriction is problematic.  I think it is better to change what 1 eats and then can sitter working on intermittent fasting regimen.  This man sounds very busy and committed to his career and will eventually has some choices to make about how to prioritize eating and nutrition and how it can fit in with his work and life.  We did not really get into that today but my sense is that is a big issue for him.  Nevertheless I gave him information about www.gaplesinstitute.org, dietdoctor.com and drberry.com  I think he may do well with a low-carb diet and lose weight and improve his metabolic status.

## 2019-04-29 NOTE — Assessment & Plan Note (Signed)
The odds on favorite is he has nonalcoholic fatty liver disease.  The autoimmune positivity does raise the chance of some type of autoimmune liver disease as well.  We had a discussion about this today.  Plan for complete abdominal ultrasound first.  I told him that liver biopsy was ultimately the best way to understand what is going on with his liver.  He wants to think about that.  Should he have autoimmune disease then he might need treatment with immunosuppressants.  Again I think that is unlikely but we just do not know so a liver biopsy would be helpful.  Work-up as below.  Celiac disease is an outside possibility.  We will check his immunity status to hepatitis a and B as he might need vaccinations.  Nutrition discussion undertaken and advice given.  See obesity  Orders Placed This Encounter  Procedures  . US Abdomen Complete  . Hepatic function panel  . Tissue transglutaminase, IgA  . IgA  . Hepatitis A antibody, total  . Hepatitis B surface antibody  . Hepatitis B Surface AntiGEN

## 2019-05-02 LAB — HEPATITIS B SURFACE ANTIBODY,QUALITATIVE: Hep B S Ab: NONREACTIVE

## 2019-05-02 LAB — HEPATITIS A ANTIBODY, TOTAL: Hepatitis A AB,Total: NONREACTIVE

## 2019-05-02 LAB — HEPATITIS B SURFACE ANTIGEN: Hepatitis B Surface Ag: NONREACTIVE

## 2019-05-02 LAB — TISSUE TRANSGLUTAMINASE, IGA: (tTG) Ab, IgA: 1 U/mL

## 2019-05-05 ENCOUNTER — Telehealth: Payer: Self-pay | Admitting: Internal Medicine

## 2019-05-05 NOTE — Progress Notes (Signed)
Labs all returned ok - normal or negative His liver tests are also back to normal.  I will explain in more detail after I see the 11/18 [place] results

## 2019-05-06 NOTE — Telephone Encounter (Signed)
See lab results for details.  

## 2019-05-11 ENCOUNTER — Ambulatory Visit
Admission: RE | Admit: 2019-05-11 | Discharge: 2019-05-11 | Disposition: A | Discharge status: Home / Self Care | Payer: 59 | Source: Ambulatory Visit | Attending: Internal Medicine | Admitting: Internal Medicine

## 2019-05-11 DIAGNOSIS — R748 Abnormal levels of other serum enzymes: Secondary | ICD-10-CM

## 2019-05-13 ENCOUNTER — Telehealth: Payer: Self-pay

## 2019-05-13 NOTE — Telephone Encounter (Signed)
Patient reports that he told you he drank 4 times a week.  He reports that he only has a alcoholic drink about 4 times a year.

## 2019-05-16 ENCOUNTER — Encounter: Payer: Self-pay | Admitting: Internal Medicine

## 2019-05-16 NOTE — Progress Notes (Signed)
Please let him know that on the basis of the ultrasound and lab testing and other factors I think he has non-alcoholic fatty liver disease  The only proven help for that is to lose weight - we discussed that some and I gave him info in AVS about some websites to explore that and low carb diets.  We can disciuss more at f/u in Jan  I did receive and documented that he only has a drink of EtOH 4x/year

## 2019-06-29 ENCOUNTER — Ambulatory Visit (INDEPENDENT_AMBULATORY_CARE_PROVIDER_SITE_OTHER): Payer: 59 | Admitting: Internal Medicine

## 2019-06-29 ENCOUNTER — Encounter: Payer: Self-pay | Admitting: Internal Medicine

## 2019-06-29 VITALS — BP 124/68 | HR 64 | Temp 96.6°F | Ht 70.0 in | Wt 266.2 lb

## 2019-06-29 DIAGNOSIS — K760 Fatty (change of) liver, not elsewhere classified: Secondary | ICD-10-CM | POA: Diagnosis not present

## 2019-06-29 DIAGNOSIS — Z6838 Body mass index (BMI) 38.0-38.9, adult: Secondary | ICD-10-CM

## 2019-06-29 DIAGNOSIS — R768 Other specified abnormal immunological findings in serum: Secondary | ICD-10-CM

## 2019-06-29 DIAGNOSIS — R748 Abnormal levels of other serum enzymes: Secondary | ICD-10-CM | POA: Diagnosis not present

## 2019-06-29 NOTE — Progress Notes (Signed)
Jesus Ward 69 y.o. 03-29-1951 [id number]  Assessment & Plan:  Class 2 severe obesity due to excess calories with serious comorbidity and body mass index (BMI) of 38.0 to 38.9 in adult ([place]) Ideal body weight is 161# - he and I agree that is unrealistic goal  Goal is to lose Lose 20#  Waist is currently 47" Measure this periodically as belly fat is main problem Weigh daily and follow trends  Give this 6 mos at least  He has been given advice about this and asked to read [employer] site and do education modules and explore low carb diets on Diet Doctor website + consider eating only 8 hrs out of day  When eating packaged foods he is encouraged to try heart healthy omega 3 like wild caught salmon, sardines  Avoid sodium nitrites   Abnormal transaminases Resolved at this time Thought is this was from NAFLD Mildly + ANA and ant-smooth mm Ab - I think probably not sig but I did explain liver bx would be more definitive. We do not plan that now. Also Iron sat 50%, ferritin 494 and HFE H63D heterozygote  Prefers to f/u labs after weight loss  NAFLD (nonalcoholic fatty liver disease) Reviewed importance of weight loss - see other assessments Overall I think he has this Suspect mildly + autoimmune testing red herring and iron studies are effects of NAFLD as opposed to primary problem though I did tell him a liver biopsy would be most definitive test not wanting to do that now and medically ok I think  He is to contact me after weight loss to recheck labs I did explain lab testing not necessarily accurate He wants to weight on autoimmune recheck and iron study recheck as well  He is naive to HAV and HBV - reasonable to consider vaccination when he returns  ANA and anti-smooth muscle weakly positive No clear signs of autoimmune disease though possible - will recheck these labs in future when he calls back. I explained that often see mildly + ANA in NAFLD but liver  bx would be needed to really tell if has autoimmune liver disease  [id number], Collier Salina, MD     Subjective:   Chief Complaint: fatty liver  HPI We reviewed his current situation and that I think he has a fatty liver problem and is too heavy. 15-20 minutes time spent reviewing this and his studies He had several ? 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 He eats lkunch at his desck and eats meats, packaged , and has started to look at contents and was surprised at how much salt was in them. He has not looked at the [facility] and Diet Doctor info that I gave him last visit. He wants to try to lose weight before I recheck any labs Wt Readings from Last 3 Encounters:  06/29/19 266 lb 4 oz (120.8 kg)  04/29/19 267 lb (121.1 kg)  07/13/18 267 lb (121.1 kg)   IMPRESSION: Small right renal cyst.  Fatty infiltration of the liver.  No other focal abnormality is noted.   Electronically Signed   By: Inez Catalina M.D.   On: 05/11/2019 11:08 Allergies  Allergen Reactions  . Ceftin [Cefuroxime Axetil] Nausea Only  . Penicillins     Unknown- had reaction as a child   Current Meds  Medication Sig  . albuterol (PROAIR HFA) 108 (90 Base) MCG/ACT  inhaler Inhale into the lungs every 6 (six) hours as needed for wheezing or shortness of breath.  Marland Kitchen aspirin 81 MG tablet Take 81 mg by mouth daily.  . cetirizine (ZYRTEC) 10 MG tablet Take 10 mg by mouth daily.  . Cholecalciferol (VITAMIN D-3) 1000 UNITS CAPS Take 2 capsules by mouth daily.   . clonazePAM (KLONOPIN) 2 MG tablet Take 2 mg by mouth at bedtime as needed.  . Fluticasone-Salmeterol (ADVAIR) 250-50 MCG/DOSE AEPB Inhale 1 puff into the lungs 2 (two) times daily as needed.  . hydrocortisone 2.5 % cream Apply topically 2 (two) times daily.  . simvastatin (ZOCOR) 40 MG tablet Take 40 mg by mouth daily.  Marland Kitchen testosterone (ANDROGEL) 50 MG/5GM (1%) GEL Place 5 g onto  the skin daily.  Marland Kitchen topiramate (TOPAMAX) 50 MG tablet Take 50 mg by mouth at bedtime.  . tretinoin (RETIN-A) 0.1 % cream Apply topically at bedtime.   Past Medical History:  Diagnosis Date  . Allergic rhinitis   . Asthma   . Diverticulosis 2007  . 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 Hearing loss 2010  . HSV-1 (herpes simplex virus 1) infection 1999  . Hx of colonic polyp 08/20/2016  . Hyperlipidemia   . IBS (irritable bowel syndrome)   . Impaired glucose tolerance   . Insomnia   . Lactose intolerance   . Malaria    1974, 1975  . Migraine   . Multiple allergies   . Nocturnal sleep-related eating disorder   . OSA (obstructive sleep apnea)   . Pneumonia [id number]  . Rosacea   . Sebaceous hyperplasia   . Seborrheic dermatitis   . Serrated polyp of colon 2018  . Sleep apnea    CPAP   . Vitamin D deficiency    Past Surgical History:  Procedure Laterality Date  . VASECTOMY  1990  . VOCAL CORD INJECTION    . [place]   Social History   Social History Narrative   Married - 2 children, wife is a Product/process development scientist firm and an Chief Strategy Officer   EtOH 4 drinks/year   Never smoker/drugs   family history includes Alcohol abuse in his father; Anxiety disorder in his daughter; Depression in his father; OCD in his daughter; Other in his father and son; Panic disorder in his daughter; Schizophrenia in his mother.   Review of Systems As above  Objective:   Physical Exam BP 124/68   Pulse 64   Temp (!) 96.6 F (35.9 C)   Ht 5\' 10"  (1.778 m)   Wt 266 lb 4 oz (120.8 kg)   BMI 38.20 kg/m    Total time 22 minutes

## 2019-06-29 NOTE — Assessment & Plan Note (Addendum)
Reviewed importance of weight loss - see other assessments Overall I think he has this Suspect mildly + autoimmune testing red herring and iron studies are effects of NAFLD as opposed to primary problem though I did tell him a liver biopsy would be most definitive test not wanting to do that now and medically ok I think  He is to contact me after weight loss to recheck labs I did explain lab testing not necessarily accurate He wants to weight on autoimmune recheck and iron study recheck as well  He is naive to HAV and HBV - reasonable to consider vaccination when he returns

## 2019-06-29 NOTE — Patient Instructions (Signed)
Just contact us when you are ready to have your labs checked again.   I appreciate the opportunity to care for you. Silvano Rusk, MD, [facility]

## 2019-06-29 NOTE — Assessment & Plan Note (Addendum)
Ideal body weight is 161# - he and I agree that is unrealistic goal  Goal is to lose Lose 20#  Waist is currently 47" Measure this periodically as belly fat is main problem Weigh daily and follow trends  Give this 6 mos at least  He has been given advice about this and asked to read [employer] site and do education modules and explore low carb diets on Diet Doctor website + consider eating only 8 hrs out of day  When eating packaged foods he is encouraged to try heart healthy omega 3 like wild caught salmon, sardines  Avoid sodium nitrites

## 2019-06-29 NOTE — Assessment & Plan Note (Signed)
Resolved at this time Thought is this was from NAFLD Mildly + ANA and ant-smooth mm Ab - I think probably not sig but I did explain liver bx would be more definitive. We do not plan that now. Also Iron sat 50%, ferritin 494 and HFE H63D heterozygote  Prefers to f/u labs after weight loss

## 2019-07-04 DIAGNOSIS — R768 Other specified abnormal immunological findings in serum: Secondary | ICD-10-CM | POA: Insufficient documentation

## 2019-07-04 NOTE — Assessment & Plan Note (Signed)
No clear signs of autoimmune disease though possible - will recheck these labs in future when he calls back. I explained that often see mildly + ANA in NAFLD but liver bx would be needed to really tell if has autoimmune liver disease

## 2019-09-29 ENCOUNTER — Other Ambulatory Visit: Payer: Self-pay

## 2019-09-29 ENCOUNTER — Encounter: Payer: Self-pay | Admitting: Internal Medicine

## 2019-09-29 ENCOUNTER — Ambulatory Visit (INDEPENDENT_AMBULATORY_CARE_PROVIDER_SITE_OTHER): Payer: 59

## 2019-09-29 ENCOUNTER — Ambulatory Visit: Payer: 59 | Admitting: Internal Medicine

## 2019-09-29 VITALS — BP 118/76 | HR 88 | Temp 98.1°F | Ht 70.0 in | Wt 258.0 lb

## 2019-09-29 DIAGNOSIS — R0609 Other forms of dyspnea: Secondary | ICD-10-CM

## 2019-09-29 DIAGNOSIS — G4733 Obstructive sleep apnea (adult) (pediatric): Secondary | ICD-10-CM

## 2019-09-29 DIAGNOSIS — J4520 Mild intermittent asthma, uncomplicated: Secondary | ICD-10-CM

## 2019-09-29 DIAGNOSIS — R0600 Dyspnea, unspecified: Secondary | ICD-10-CM

## 2019-09-29 NOTE — Progress Notes (Signed)
HPI Male never smoker followed for OSA, insomnia, complicated by asthma, GERD, hyperlipidemia NPSG 03/27/94- AHI 53/ hr, weight 225 lbs, CPAP titrated to 10. -------------------------------------------------------------------------------------------------   05/14/2018-69 year old male never smoker followed for OSA, insomnia, complicated by asthma, chronic sinusitis, GERD, hyperlipidemia CPAP 10/ [facility] -----Follows for: OSA, needs new mask, Doing well on CPAP but concerned about the mask not sealing tight due to wear and tear Body weight 266 pounds CPAP machine is too old to download with no chip.  Mask and headgear are worn out.  He uses it every night and takes it with him with his frequent travel. Denies significant active health problems for our attention otherwise.  09/29/19- 50 yoM never smoker followed gfor OSA, Insomnia, complicated by asthma, chronic sinusitis, GERD, hyperlipidemia CPAP auto 5-20/ [facility] Download compliance 100%, AHI 0.9/ hr- doing well Albuterol hfa, Advair 250, clonazepam 2 mg hs, Body weight today 258 lbs Unfamiliar exertion moving to new home. DOE carrying boxes; had to pace himself.No CP. Litle wheeze. Using rescue 2x/ day.  ROS-see HPI  += positive Constitutional:   + weight gain,No- night sweats, fevers, chills, fatigue, lassitude. HEENT:   No-  headaches, difficulty swallowing, tooth/dental problems, sore throat,       No-  sneezing, itching, ear ache, nasal congestion, post nasal drip,  CV:  No-   chest pain, orthopnea, PND, swelling in lower extremities, anasarca,                                                    dizziness, palpitations Resp: +  shortness of breath with exertion or at rest.              No-   productive cough,  No non-productive cough,  No- coughing up of blood.              No-   change in color of sputm, + wheezing.   Skin: No-   rash or lesions. GI:  No-   heartburn, indigestion, abdominal pain, nausea, vomiting, diarrhea,          change in bowel habits, loss of appetite GU: No-   dysuria, change in color of urine, no urgency or frequency.  No- flank pain. MS:  No-   joint pain or swelling.  No- decreased range of motion.  No- back pain. Neuro-     nothing unusual Psych:  No- change in mood or affect. No depression or anxiety.  No memory loss.  OBJ- Physical Exam General- Alert, Oriented, Affect-appropriate, Distress- none acute,  + overweight Skin- rash-none, lesions- none, excoriation- none Lymphadenopathy- none Head- atraumatic            Eyes- Gross vision intact, PERRLA, conjunctivae and secretions clear            Ears- Hearing, canals-normal            Nose- Clear, no-Septal dev, mucus, polyps, erosion, perforation             Throat- Mallampati III , mucosa clear , drainage- none, tonsils- atrophic Neck- flexible , trachea midline, no stridor , thyroid nl, carotid no bruit Chest - symmetrical excursion , unlabored           Heart/CV- RRR , no murmur , no gallop  , no rub, nl s1 s2                           -  JVD- none , edema- none, stasis changes- none, varices- none           Lung- clear to P&A, wheeze- none, cough- none , dullness-none, rub- none           Chest wall-  Abd- tender-no, distended-no, bowel sounds-present, HSM- no Br/ Gen/ Rectal- Not done, not indicated Extrem- cyanosis- none, clubbing, none, atrophy- none, strength- nl Neuro- grossly intact to observation

## 2019-09-29 NOTE — Patient Instructions (Signed)
We can continue CPAP auto 5-20  Order- CXR  Dx  Dypsnea on exertion  Order- Schedule PFT   Dx dyspnea on exertion

## 2019-09-30 ENCOUNTER — Encounter: Payer: Self-pay | Admitting: Specialist

## 2019-10-07 ENCOUNTER — Other Ambulatory Visit (HOSPITAL_COMMUNITY): Payer: 59

## 2019-10-21 NOTE — Assessment & Plan Note (Signed)
Dyspnea with unfamiliar exertion may just reflect conditioning, but some response to bronchodilator suggests an asthma component. Discussed potential for CAD, to discuss with his PCP to consider stress test. Plan- PFT, CXR

## 2019-10-21 NOTE — Assessment & Plan Note (Signed)
Bengfits from CPAP Plan- continue auto 5-20

## 2019-11-08 ENCOUNTER — Other Ambulatory Visit (HOSPITAL_COMMUNITY)
Admission: RE | Admit: 2019-11-08 | Discharge: 2019-11-08 | Disposition: A | Discharge status: Home / Self Care | Payer: 59 | Source: Ambulatory Visit | Attending: Internal Medicine | Admitting: Internal Medicine

## 2019-11-08 DIAGNOSIS — Z01812 Encounter for preprocedural laboratory examination: Secondary | ICD-10-CM | POA: Diagnosis present

## 2019-11-08 DIAGNOSIS — Z20822 Contact with and (suspected) exposure to covid-19: Secondary | ICD-10-CM | POA: Insufficient documentation

## 2019-11-08 LAB — SARS CORONAVIRUS 2 (TAT 6-24 HRS): SARS Coronavirus 2: NEGATIVE

## 2019-11-11 ENCOUNTER — Ambulatory Visit (INDEPENDENT_AMBULATORY_CARE_PROVIDER_SITE_OTHER): Payer: 59 | Admitting: Internal Medicine

## 2019-11-11 ENCOUNTER — Other Ambulatory Visit: Payer: Self-pay

## 2019-11-11 DIAGNOSIS — R0600 Dyspnea, unspecified: Secondary | ICD-10-CM

## 2019-11-11 DIAGNOSIS — R0609 Other forms of dyspnea: Secondary | ICD-10-CM

## 2019-11-11 LAB — PULMONARY FUNCTION TEST
DL/VA % pred: 122 %
DL/VA: 5.04 ml/min/mmHg/L
DLCO cor % pred: 117 %
DLCO cor: 31.35 ml/min/mmHg
DLCO unc % pred: 117 %
DLCO unc: 31.35 ml/min/mmHg
FEF 25-75 Post: 3.73 L/sec
FEF 25-75 Pre: 3.45 L/sec
FEF2575-%Change-Post: 8 %
FEF2575-%Pred-Post: 139 %
FEF2575-%Pred-Pre: 129 %
FEV1-%Change-Post: 6 %
FEV1-%Pred-Post: 106 %
FEV1-%Pred-Pre: 99 %
FEV1-Post: 3.61 L
FEV1-Pre: 3.39 L
FEV1FVC-%Change-Post: 0 %
FEV1FVC-%Pred-Pre: 107 %
FEV6-%Change-Post: 6 %
FEV6-%Pred-Post: 103 %
FEV6-%Pred-Pre: 97 %
FEV6-Post: 4.49 L
FEV6-Pre: 4.22 L
FEV6FVC-%Change-Post: 0 %
FEV6FVC-%Pred-Post: 105 %
FEV6FVC-%Pred-Pre: 104 %
FVC-%Change-Post: 6 %
FVC-%Pred-Post: 98 %
FVC-%Pred-Pre: 92 %
FVC-Post: 4.5 L
FVC-Pre: 4.25 L
Post FEV1/FVC ratio: 80 %
Post FEV6/FVC ratio: 100 %
Pre FEV1/FVC ratio: 80 %
Pre FEV6/FVC Ratio: 100 %
RV % pred: 94 %
RV: 2.23 L
TLC % pred: 93 %
TLC: 6.55 L

## 2019-11-11 NOTE — Progress Notes (Signed)
Full PFT performed today today.

## 2019-11-16 ENCOUNTER — Telehealth: Payer: Self-pay | Admitting: Internal Medicine

## 2019-11-16 NOTE — Telephone Encounter (Signed)
Patient talked to [facility] at 1:02pm and patient confirmed he talked to her. Nothing further needed at this time.

## 2019-11-16 NOTE — Telephone Encounter (Signed)
Attempted to call pt but unable to reach. Left message for pt to return call.  Since we keep playing phone tag with pt, when pt calls back, please keep him on the phone until someone is able to take the call.

## 2019-11-16 NOTE — Telephone Encounter (Signed)
Baird Lyons D, MD  11/14/2019 9:53 AM EDT    Pulmonary Function Test- results were normal. That is great, and it means shortness of breath is not due to a lung condition.    Attempted to call pt but unable to reach. Left message for him to return call.

## 2019-11-16 NOTE — Telephone Encounter (Signed)
Left message for patient to call back  

## 2019-11-16 NOTE — Telephone Encounter (Signed)
Patient is returning phone call. Patient phone number is [phone].

## 2019-11-16 NOTE — Progress Notes (Signed)
Patient identification verified, results of recent PFT reviewed.  Per Dr. Annamaria Boots, Pulmonary Function Test- results were normal. That is great, and it means shortness of breath is not due to a lung condition.   Patient verbalized understanding of results, appt recall for August reviewed.

## 2019-11-16 NOTE — Telephone Encounter (Signed)
Pt returning a phone call. Pt can be reached [phone]

## 2020-04-11 ENCOUNTER — Encounter: Payer: Self-pay | Admitting: Internal Medicine

## 2020-04-16 ENCOUNTER — Encounter: Payer: Self-pay | Admitting: Internal Medicine

## 2020-04-16 ENCOUNTER — Ambulatory Visit (INDEPENDENT_AMBULATORY_CARE_PROVIDER_SITE_OTHER): Payer: BC Managed Care – PPO | Admitting: Internal Medicine

## 2020-04-16 ENCOUNTER — Other Ambulatory Visit: Payer: Self-pay

## 2020-04-16 DIAGNOSIS — J4520 Mild intermittent asthma, uncomplicated: Secondary | ICD-10-CM

## 2020-04-16 DIAGNOSIS — G4733 Obstructive sleep apnea (adult) (pediatric): Secondary | ICD-10-CM

## 2020-04-16 NOTE — Patient Instructions (Signed)
We can continue CPAP auto 5-20  Really glad you are doing so well  Please call if we can help

## 2020-04-16 NOTE — Progress Notes (Signed)
HPI Male never smoker followed for OSA, insomnia, complicated by asthma, GERD, hyperlipidemia NPSG 03/27/94- AHI 53/ hr, weight 225 lbs, CPAP titrated to 10. PFT 11/11/19- WNL -------------------------------------------------------------------------------------------------   09/29/19- 67 yoM never smoker followed gfor OSA, Insomnia, complicated by asthma, chronic sinusitis, GERD, hyperlipidemia CPAP auto 5-20/ [facility] Download compliance 100%, AHI 0.9/ hr- doing well Albuterol hfa, Advair 250, clonazepam 2 mg hs, Body weight today 258 lbs Unfamiliar exertion moving to new home. DOE carrying boxes; had to pace himself.No CP. Litle wheeze. Using rescue 2x/ day.  04/16/20- 69 yoM never smoker followed gfor OSA, Insomnia, complicated by Asthma, DOE chronic sinusitis, GERD, hyperlipidemia Albuterol hfa, Advair 250, clonazepam 2 mg hs, CPAP auto 5-20/ [facility] Download- compliance 90%, AHI 1/ hr Body weight today- 260 lbs Covid vax- 2 Moderna Flu vax-  Feeling quite well now, no acute concerns. Comfortable with CPAP. No longer much aware of DOE. PFT 11/11/19- WNL CXR 09/30/19-  Lungs clear.  Cardiac silhouette within normal limits.  ROS-see HPI  += positive Constitutional:   + weight gain,No- night sweats, fevers, chills, fatigue, lassitude. HEENT:   No-  headaches, difficulty swallowing, tooth/dental problems, sore throat,       No-  sneezing, itching, ear ache, nasal congestion, post nasal drip,  CV:  No-   chest pain, orthopnea, PND, swelling in lower extremities, anasarca,                                                    dizziness, palpitations Resp:   shortness of breath with exertion or at rest.              No-   productive cough,  No non-productive cough,  No- coughing up of blood.              No-   change in color of sputm, + wheezing.   Skin: No-   rash or lesions. GI:  No-   heartburn, indigestion, abdominal pain, nausea, vomiting, diarrhea,                 change in bowel habits,  loss of appetite GU: No-   dysuria, change in color of urine, no urgency or frequency.  No- flank pain. MS:  No-   joint pain or swelling.  No- decreased range of motion.  No- back pain. Neuro-     nothing unusual Psych:  No- change in mood or affect. No depression or anxiety.  No memory loss.  OBJ- Physical Exam General- Alert, Oriented, Affect-appropriate, Distress- none acute,  + overweight Skin- rash-none, lesions- none, excoriation- none Lymphadenopathy- none Head- atraumatic            Eyes- Gross vision intact, PERRLA, conjunctivae and secretions clear            Ears- Hearing, canals-normal            Nose- Clear, no-Septal dev, mucus, polyps, erosion, perforation             Throat- Mallampati III , mucosa clear , drainage- none, tonsils- atrophic Neck- flexible , trachea midline, no stridor , thyroid nl, carotid no bruit Chest - symmetrical excursion , unlabored           Heart/CV- RRR , no murmur , no gallop  , no rub, nl s1 s2                           -  JVD- none , edema- none, stasis changes- none, varices- none           Lung- clear to P&A, wheeze- none, cough- none , dullness-none, rub- none           Chest wall-  Abd- tender-no, distended-no, bowel sounds-present, HSM- no Br/ Gen/ Rectal- Not done, not indicated Extrem- cyanosis- none, clubbing, none, atrophy- none, strength- nl Neuro- grossly intact to observation

## 2020-05-05 ENCOUNTER — Encounter: Payer: Self-pay | Admitting: Internal Medicine

## 2020-05-05 NOTE — Assessment & Plan Note (Signed)
Benefits from CPAP with good compliance and control Plan- continue auto 5-20 

## 2020-05-05 NOTE — Assessment & Plan Note (Signed)
Good control with Advair and albuterol hfa used for intervals if needed

## 2020-07-12 ENCOUNTER — Telehealth: Payer: Self-pay | Admitting: Internal Medicine

## 2020-07-12 DIAGNOSIS — G4733 Obstructive sleep apnea (adult) (pediatric): Secondary | ICD-10-CM

## 2020-07-12 NOTE — Telephone Encounter (Signed)
Mr Alroy Dust sent question asking what I though of Inspire nerve stimulator as an alternative to CPAP for him.  I suspect he is heavier than their requirement for Inspire, but I would be happy to refer him to a local provider to discuss it.  Please offer referral to Dr Melida Quitter, ENT to discuss Inspire therapy for sleep apnea.

## 2020-07-13 NOTE — Telephone Encounter (Signed)
Referral to ENT has been placed. Called and spoke with pt letting him know this had been done and stated to him that ENT office will call him with appt. Pt verbalized understanding. Nothing further needed.

## 2020-07-13 NOTE — Addendum Note (Signed)
Addended by: Lorretta Harp on: 07/13/2020 08:59 AM   Modules accepted: Orders

## 2020-07-20 ENCOUNTER — Other Ambulatory Visit: Payer: Self-pay | Admitting: Family Medicine

## 2020-07-20 DIAGNOSIS — R221 Localized swelling, mass and lump, neck: Secondary | ICD-10-CM

## 2020-08-08 ENCOUNTER — Other Ambulatory Visit: Payer: BC Managed Care – PPO

## 2020-08-16 ENCOUNTER — Other Ambulatory Visit: Payer: Self-pay

## 2020-08-16 ENCOUNTER — Ambulatory Visit
Admission: RE | Admit: 2020-08-16 | Discharge: 2020-08-16 | Disposition: A | Discharge status: Home / Self Care | Payer: BC Managed Care – PPO | Source: Ambulatory Visit | Attending: Family Medicine | Admitting: Family Medicine

## 2020-08-16 DIAGNOSIS — R221 Localized swelling, mass and lump, neck: Secondary | ICD-10-CM

## 2020-08-16 MED ORDER — IOPAMIDOL (ISOVUE-300) INJECTION 61%
75.0000 mL | Freq: Once | INTRAVENOUS | Status: AC | PRN
  Administered 2020-08-16: 75 mL via INTRAVENOUS

## 2020-11-21 ENCOUNTER — Telehealth: Payer: Self-pay | Admitting: Internal Medicine

## 2020-11-21 DIAGNOSIS — G4733 Obstructive sleep apnea (adult) (pediatric): Secondary | ICD-10-CM

## 2020-11-21 NOTE — Telephone Encounter (Signed)
Called and spoke with Patient.  Patient stated his traditional cpap machine is needing a whole new head gear set.  Patient stated he has been using his portable cpap, because he is unable to use his regular cpap.  Patient stated his portable cpap machine is needing a possible pressure change.  Patient stated he is waking gasping for air.  Patient DME is [facility].  Message routed to Dr. Annamaria Boots to advise    LOV 04/16/20 Instructions    Return in about 1 year (around 04/16/2021). We can continue CPAP auto 5-20  Really glad you are doing so well  Please call if we can help     Allergies  Allergen Reactions  . Ceftin [Cefuroxime Axetil] Nausea Only  . Penicillins     Unknown- had reaction as a child   Current Outpatient Medications on File Prior to Visit  Medication Sig Dispense Refill  . albuterol (PROAIR HFA) 108 (90 Base) MCG/ACT inhaler Inhale into the lungs every 6 (six) hours as needed for wheezing or shortness of breath.    Marland Kitchen aspirin 81 MG tablet Take 81 mg by mouth daily.    . cetirizine (ZYRTEC) 10 MG tablet Take 10 mg by mouth daily.    . Cholecalciferol (VITAMIN D-3) 1000 UNITS CAPS Take 2 capsules by mouth daily.     . clonazePAM (KLONOPIN) 2 MG tablet Take 2 mg by mouth at bedtime as needed.  1  . Fluticasone-Salmeterol (ADVAIR) 250-50 MCG/DOSE AEPB Inhale 1 puff into the lungs 2 (two) times daily as needed.    . hydrocortisone 2.5 % cream Apply topically 2 (two) times daily.    . simvastatin (ZOCOR) 40 MG tablet Take 40 mg by mouth daily.    Marland Kitchen testosterone (ANDROGEL) 50 MG/5GM (1%) GEL Place 5 g onto the skin daily.    Marland Kitchen topiramate (TOPAMAX) 100 MG tablet Take 100 mg by mouth at bedtime.    . topiramate (TOPAMAX) 50 MG tablet Take 50 mg by mouth at bedtime.    . tretinoin (RETIN-A) 0.1 % cream Apply topically at bedtime.     No current facility-administered medications on file prior to visit.

## 2020-11-22 NOTE — Telephone Encounter (Signed)
Order- DME [facility]- please replace CPAP headgear, mask of choice and supplies            Please change his travel CPAP machine to auto 5-20

## 2020-11-22 NOTE — Telephone Encounter (Signed)
Called and spoke with patient to let him know that we are going to send in order to [facility]. He expressed understanding. Order sent. Nothing further needed at this time.

## 2020-12-05 ENCOUNTER — Telehealth: Payer: Self-pay | Admitting: Internal Medicine

## 2020-12-05 DIAGNOSIS — G4733 Obstructive sleep apnea (adult) (pediatric): Secondary | ICD-10-CM

## 2020-12-05 NOTE — Telephone Encounter (Signed)
I called and spoke with pt and he stated that he is trying to find someone to service his resmed mini--- he stated that he bought this out right since his insurance would not cover the cost of it.  He stated that he stayed at his son's house a couple of weeks ago and his son told him that he was snoring.  He stated that he feels that his settings need to be adjusted on the mini.    I advised him that most of the DME companies will not service a machine that was not bought from them.  Pt stated that he is willing to buy another machine if that is what is needed to be able to have it serviced.   CY please advise. Thanks

## 2020-12-05 NOTE — Telephone Encounter (Signed)
Let's see what [facility] says---  Please order DME [facility], which manages his main CPAP, to adjust his travel CPAP machine to auto 5-20 and refill any needed mask of choice and supplies.

## 2020-12-06 NOTE — Telephone Encounter (Signed)
ATC Melissa and Brad with [facility] to see if they would be able to help or if they knew what direction to send patient. Had to leave voice mails for both of them to call me back

## 2020-12-06 NOTE — Telephone Encounter (Signed)
Called and spoke with patient to let him know that I spoke with Melissa from [facility] and she advised that they should be able to help patient with his mini. She said to send the order over and if there was any issues she would let us know. Told patient I would place order and he should get a call from [facility] once they receive it. Patient expressed understanding. Advised him to call if he needs anything else. Nothing further needed at this time.

## 2020-12-14 ENCOUNTER — Telehealth: Payer: Self-pay | Admitting: Internal Medicine

## 2020-12-14 NOTE — Telephone Encounter (Signed)
Pt is calling in regards to his mini stated that he has not received a call from [facility] and was wanting to inform [facility]. Pls regard; [phone]

## 2020-12-14 NOTE — Telephone Encounter (Signed)
Called and spoke with patient. He stated that he had not heard anything from [facility] in regards to adjusting his travel cpap machine. I advised him that based on his chart, the order was not signed until yesterday afternoon which means the order did not reach [facility] until this morning.   He is aware that it will take at least 2 weeks for the order to process. He will call us back if he has not heard anything in that timeframe.   Nothing further needed at time of call.

## 2021-01-01 ENCOUNTER — Telehealth: Payer: Self-pay | Admitting: Internal Medicine

## 2021-01-01 NOTE — Telephone Encounter (Signed)
Called and spoke with pt to see if he could come in on 7/14 and pt said that he could not due to having an appt at the office.  Pt said that he has nothing on the calendar for Monday 7/18. Dr. Annamaria Boots, please advise if you are okay with [place] working pt in to see you.

## 2021-01-01 NOTE — Telephone Encounter (Signed)
Yes, I can see him. It sounds as if he wants a service meeting with [facility]. Please verify, and if appropriate, ask our [facility] to contact [facility] for him to expedite this. Thanks.

## 2021-01-02 NOTE — Telephone Encounter (Signed)
LMTCB

## 2021-01-03 NOTE — Telephone Encounter (Signed)
Lmtcb for pt.  

## 2021-01-07 NOTE — Telephone Encounter (Signed)
Called and spoke with Patient.  Patient stated he uses a mini travel cpap machine and was told recently by family that he snores on his travel  cpap machine.  Patient stated he called [facility] several times on several days leaving messages, and no replies.  Patient stated he called [facility] number and spoke with someone who told him they needed a order to check his mini cpap, but [facility] could not do any service, or changes to his mini/travel cpap.  Order was placed 12/06/20 for [facility] to check and adjust travel cpap. Patient stated no one at [facility] has contacted him regarding adjusting his travel cpap. 11/22/20 Dr. Annamaria Boots order placed to adjust travel cpap pressure to auto 5-20.  Called and spoke with Melissa, [facility] who stated had [facility] had attempted to call Patient x's 2. Melissa stated order is ready for [facility] to assist Patient with travel cpap.  Lenna Sciara stated Patient needs to call [facility] [phone] ext [phone].  Called and spoke with Patient.  Lenna Sciara 's message and Angela's number given to Patient.  Nothing further at this time.

## 2021-04-18 ENCOUNTER — Other Ambulatory Visit: Payer: Self-pay | Admitting: Family Medicine

## 2021-04-19 LAB — SARS CORONAVIRUS 2 (TAT 6-24 HRS): SARS Coronavirus 2: NEGATIVE

## 2021-07-22 ENCOUNTER — Other Ambulatory Visit: Payer: Self-pay | Admitting: Family Medicine

## 2021-07-23 LAB — SARS CORONAVIRUS 2 (TAT 6-24 HRS): SARS Coronavirus 2: NEGATIVE

## 2021-07-25 ENCOUNTER — Other Ambulatory Visit: Payer: Self-pay | Admitting: Family Medicine

## 2021-07-25 DIAGNOSIS — R1905 Periumbilic swelling, mass or lump: Secondary | ICD-10-CM

## 2021-08-19 ENCOUNTER — Ambulatory Visit
Admission: RE | Admit: 2021-08-19 | Discharge: 2021-08-19 | Disposition: A | Discharge status: Home / Self Care | Payer: Self-pay | Source: Ambulatory Visit | Attending: Family Medicine | Admitting: Family Medicine

## 2021-08-19 DIAGNOSIS — R1905 Periumbilic swelling, mass or lump: Secondary | ICD-10-CM

## 2021-08-19 MED ORDER — IOPAMIDOL (ISOVUE-300) INJECTION 61%
100.0000 mL | Freq: Once | INTRAVENOUS | Status: AC | PRN
  Administered 2021-08-19: 100 mL via INTRAVENOUS

## 2021-10-10 ENCOUNTER — Encounter: Payer: Self-pay | Admitting: Psychology

## 2021-10-25 ENCOUNTER — Encounter: Payer: Self-pay | Admitting: Internal Medicine

## 2021-11-25 ENCOUNTER — Telehealth: Payer: Self-pay | Admitting: Internal Medicine

## 2021-11-25 DIAGNOSIS — G4733 Obstructive sleep apnea (adult) (pediatric): Secondary | ICD-10-CM

## 2021-11-26 NOTE — Telephone Encounter (Signed)
ATC patient, LVM to return call after 8 am 6/7.  Patient has not been seen since 04/16/20 and will need to schedule and OV.  Download printed and placed in Dr. Janee Morn cabinet.  Dr. Annamaria Boots, Please see download and advise.  Left message for patient to schedule OV as he has not been seen since 04/16/2020.  Thank you.

## 2021-11-27 NOTE — Telephone Encounter (Signed)
Attempted to call pt but unable to reach. Left message for him to return call. °

## 2021-11-27 NOTE — Telephone Encounter (Signed)
CPAP download reviewed. There is very good compliance and control. Unless CPAP is physically disturbing or uncomfortable, I don't think CPAP is cause for current difficulty sleeping. I would be happy to see for OV if he wants, or he may want to talk with his PCP about stress and insomnia management.

## 2021-12-11 NOTE — Telephone Encounter (Signed)
Order placed for pt to receive two new water filters for his CPAP. Called and spoke with pt letting him know this had been done and he verbalized understanding. Nothing further needed.

## 2021-12-11 NOTE — Telephone Encounter (Signed)
Ok to send order as requested Thanks

## 2021-12-11 NOTE — Telephone Encounter (Signed)
Called and spoke with pt who is requesting to have an Rx for two CPAP water filters sent to DME. Pt states that he has two different residencies and the current ones that he has is old so he would like to have two new ones.  Dr. Annamaria Boots, please advise.

## 2022-01-02 ENCOUNTER — Telehealth: Payer: Self-pay | Admitting: Internal Medicine

## 2022-01-02 DIAGNOSIS — G4733 Obstructive sleep apnea (adult) (pediatric): Secondary | ICD-10-CM

## 2022-01-02 NOTE — Telephone Encounter (Signed)
Order has been placed for pt to receive two new cpap machines with water filters from [facility] due to the prior order that had been placed had been placed wrong. Called and spoke with pt letting him know this had been done and he verbalized understanding. Nothing further needed.

## 2022-01-08 ENCOUNTER — Telehealth: Payer: Self-pay | Admitting: Internal Medicine

## 2022-01-08 NOTE — Telephone Encounter (Signed)
Jesus Ward; Jesus Ward, Jesus Ward; Jesus Ward Hello,   Patient will need face 2 face office visit showing usage and benefits to process the cpap order. All I see in the chart notes are phone message for the last year.   Also, insurance will only cover one cpap at a time.   Thank you,   Demetrius Charity

## 2022-01-08 NOTE — Telephone Encounter (Signed)
Called patient and left voicemail for him to call office back to schedule office visit with Dr Annamaria Boots or APP for cpap compliance.

## 2022-01-22 ENCOUNTER — Encounter: Payer: Self-pay | Admitting: Adult Health

## 2022-01-22 ENCOUNTER — Ambulatory Visit (INDEPENDENT_AMBULATORY_CARE_PROVIDER_SITE_OTHER): Payer: No Typology Code available for payment source | Admitting: Adult Health

## 2022-01-22 DIAGNOSIS — J4520 Mild intermittent asthma, uncomplicated: Secondary | ICD-10-CM | POA: Diagnosis not present

## 2022-01-22 DIAGNOSIS — G4733 Obstructive sleep apnea (adult) (pediatric): Secondary | ICD-10-CM | POA: Diagnosis not present

## 2022-01-22 NOTE — Patient Instructions (Signed)
Keep up good work  Continue on CPAP At bedtime   Work on healthy weight  Do not drive if sleepy  Continue on Advair Twice daily  , rinse after use Albuterol inhaler As needed   Follow up with Dr. Annamaria Boots  in 1 year and As needed

## 2022-01-22 NOTE — Progress Notes (Unsigned)
K  '@Patient'$  ID: Jesus Ward, male    DOB: May 26, 1951, 71 y.o.   MRN: [id number]  Chief Complaint  Patient presents with   Follow-up    Referring provider: Derinda Late, MD  HPI: 71 year old male never smoker followed for obstructive sleep apnea and asthma. Medical history significant for chronic sinusitis and GERD  TEST/EVENTS :   01/22/22 Follow up ; OSA and Asthma  Patient presents for a follow-up visit.  Last seen in the office October 2021.  Patient says that he continues on CPAP at bedtime.  Typically wears his CPAP every single night cannot sleep without it.  Gets in about 8 to 9 hours of sleep.  Patient feels that he benefits from CPAP with decreased daytime sleepiness.  Patient has recently gotten a new CPAP machine which he says is working well.  Patient does travel a lot between [place] and [place] for work. CPAP download shows excellent compliance with daily average usage at 8.5 hours.  Patient is on auto CPAP 4 to 20 cm H2O.  AHI 0.6.  Minimal leaks.  Patient has underlying asthma.  Says that his breathing has been doing well.  Has no flare of cough or wheezing.  Remains on Advair twice daily.  Rare albuterol use. Has had no change in activity level. Takes Zyrtec as needed.  Allergies  Allergen Reactions   Ceftin [Cefuroxime Axetil] Nausea Only   Penicillins     Unknown- had reaction as a child    Immunization History  Administered Date(s) Administered   Hepatitis A 12/07/2009, 06/21/2010   Hepatitis B 12/07/2009, 06/21/2010, 10/07/2010   Influenza Split 03/23/2013   Influenza Whole 06/22/2018   Moderna Sars-Covid-2 Vaccination 09/02/2019, 09/30/2019   Pneumococcal Conjugate-13 05/14/2016   Pneumococcal Polysaccharide-23 05/06/2013   Tdap 10/19/2017   Zoster Recombinat (Shingrix) 09/11/2017, 01/01/2018    Past Medical History:  Diagnosis Date   Allergic rhinitis    Asthma    Diverticulosis 2007   Dysphonia    secondary to a post viral vocal  cord neuropathy in 2011-12   Hearing loss 2010   HSV-1 (herpes simplex virus 1) infection 1999   Hx of colonic polyp 08/20/2016   Hyperlipidemia    IBS (irritable bowel syndrome)    Impaired glucose tolerance    Insomnia    Lactose intolerance    Malaria    1974, 1975   Migraine    Multiple allergies    Nocturnal sleep-related eating disorder    OSA (obstructive sleep apnea)    Pneumonia 5009,3818   Rosacea    Sebaceous hyperplasia    Seborrheic dermatitis    Serrated polyp of colon 2018   Sleep apnea    CPAP    Vitamin D deficiency     Tobacco History: Social History   Tobacco Use  Smoking Status Never  Smokeless Tobacco Never   Counseling given: Not Answered   Outpatient Medications Prior to Visit  Medication Sig Dispense Refill   albuterol (PROAIR HFA) 108 (90 Base) MCG/ACT inhaler Inhale into the lungs every 6 (six) hours as needed for wheezing or shortness of breath.     aspirin 81 MG tablet Take 81 mg by mouth daily.     cetirizine (ZYRTEC) 10 MG tablet Take 10 mg by mouth daily.     Cholecalciferol (VITAMIN D-3) 1000 UNITS CAPS Take 2 capsules by mouth daily.      clonazePAM (KLONOPIN) 2 MG tablet Take 2 mg by mouth at bedtime as needed.  1  Fluticasone-Salmeterol (ADVAIR) 250-50 MCG/DOSE AEPB Inhale 1 puff into the lungs 2 (two) times daily as needed.     hydrocortisone 2.5 % cream Apply topically 2 (two) times daily.     simvastatin (ZOCOR) 40 MG tablet Take 40 mg by mouth daily.     testosterone (ANDROGEL) 50 MG/5GM (1%) GEL Place 5 g onto the skin daily.     topiramate (TOPAMAX) 100 MG tablet Take 100 mg by mouth at bedtime.     topiramate (TOPAMAX) 50 MG tablet Take 50 mg by mouth at bedtime.     tretinoin (RETIN-A) 0.1 % cream Apply topically at bedtime.     No facility-administered medications prior to visit.     Review of Systems:   Constitutional:   No  weight loss, night sweats,  Fevers, chills, fatigue, or  lassitude.  HEENT:   No headaches,   Difficulty swallowing,  Tooth/dental problems, or  Sore throat,                No sneezing, itching, ear ache,  +nasal congestion, post nasal drip,   CV:  No chest pain,  Orthopnea, PND, swelling in lower extremities, anasarca, dizziness, palpitations, syncope.   GI  No heartburn, indigestion, abdominal pain, nausea, vomiting, diarrhea, change in bowel habits, loss of appetite, bloody stools.   Resp: No shortness of breath with exertion or at rest.  No excess mucus, no productive cough,  No non-productive cough,  No coughing up of blood.  No change in color of mucus.  No wheezing.  No chest wall deformity  Skin: no rash or lesions.  GU: no dysuria, change in color of urine, no urgency or frequency.  No flank pain, no hematuria   MS:  No joint pain or swelling.  No decreased range of motion.  No back pain.    Physical Exam  BP 122/78 (BP Location: Left Arm, Cuff Size: Large)   Pulse 69   Temp 98.1 F (36.7 C) (Temporal)   Ht '5\' 10"'$  (1.778 m)   Wt 257 lb 3.2 oz (116.7 kg)   SpO2 95%   BMI 36.90 kg/m   GEN: A/Ox3; pleasant , NAD, well nourished    HEENT:  [place]/AT,   NOSE-clear, THROAT-clear, no lesions, no postnasal drip or exudate noted.  Class II-III MP airway  NECK:  Supple w/ fair ROM; no JVD; normal carotid impulses w/o bruits; no thyromegaly or nodules palpated; no lymphadenopathy.    RESP  Clear  P & A; w/o, wheezes/ rales/ or rhonchi. no accessory muscle use, no dullness to percussion  CARD:  RRR, no m/r/g, no peripheral edema, pulses intact, no cyanosis or clubbing.  GI:   Soft & nt; nml bowel sounds; no organomegaly or masses detected.   Musco: Warm bil, no deformities or joint swelling noted.   Neuro: alert, no focal deficits noted.    Skin: Warm, no lesions or rashes    Lab Results:  CBC No results found for: "WBC", "RBC", "HGB", "HCT", "PLT", "MCV", "MCH", "MCHC", "RDW", "LYMPHSABS", "MONOABS", "EOSABS", "BASOSABS"  BMET No results found for: "NA",  "K", "CL", "CO2", "GLUCOSE", "BUN", "CREATININE", "CALCIUM", "GFRNONAA", "GFRAA"  BNP No results found for: "BNP"  ProBNP No results found for: "PROBNP"  Imaging: No results found.       Latest Ref Rng & Units 11/11/2019   11:53 AM  PFT Results  FVC-Pre L 4.25   FVC-Predicted Pre % 92   FVC-Post L 4.50   FVC-Predicted Post % 98  Pre FEV1/FVC % % 80   Post FEV1/FCV % % 80   FEV1-Pre L 3.39   FEV1-Predicted Pre % 99   FEV1-Post L 3.61   DLCO uncorrected ml/min/mmHg 31.35   DLCO UNC% % 117   DLCO corrected ml/min/mmHg 31.35   DLCO COR %Predicted % 117   DLVA Predicted % 122   TLC L 6.55   TLC % Predicted % 93   RV % Predicted % 94     No results found for: "NITRICOXIDE"      Assessment & Plan:   Obstructive sleep apnea Excellent control and compliance on CPAP.  Continue all current settings  Plan  Patient Instructions  Keep up good work  Continue on CPAP At bedtime   Work on healthy weight  Do not drive if sleepy  Continue on Advair Twice daily  , rinse after use Albuterol inhaler As needed   Follow up with Dr. Annamaria Boots  in 1 year and As needed        Asthma, mild intermittent Currently well controlled continue on current regimen     Rexene Edison, NP

## 2022-01-23 NOTE — Assessment & Plan Note (Signed)
Currently well controlled continue on current regimen

## 2022-01-23 NOTE — Assessment & Plan Note (Signed)
Excellent control and compliance on CPAP.  Continue all current settings  Plan  Patient Instructions  Keep up good work  Continue on CPAP At bedtime   Work on healthy weight  Do not drive if sleepy  Continue on Advair Twice daily  , rinse after use Albuterol inhaler As needed   Follow up with Dr. Annamaria Boots  in 1 year and As needed

## 2022-01-24 NOTE — Telephone Encounter (Signed)
Will send to [facility] as FYI that pt had OV on 8/2. I have messaged Brad with [employer] as FYI also.

## 2022-04-02 ENCOUNTER — Encounter: Payer: No Typology Code available for payment source | Attending: Psychology | Admitting: Psychology

## 2022-04-02 DIAGNOSIS — F5101 Primary insomnia: Secondary | ICD-10-CM

## 2022-04-02 DIAGNOSIS — G4733 Obstructive sleep apnea (adult) (pediatric): Secondary | ICD-10-CM

## 2022-04-02 DIAGNOSIS — R4189 Other symptoms and signs involving cognitive functions and awareness: Secondary | ICD-10-CM

## 2022-04-03 ENCOUNTER — Encounter: Payer: Self-pay | Admitting: Psychology

## 2022-04-03 NOTE — Progress Notes (Addendum)
Neuropsychological Consultation   Patient:   Jesus Ward   DOB:   [birth year]  MR Number:  [id number]  Location:  [facility] [place], [place] [id number] [place] [address] Dept: [phone]           Date of Service:   04/02/2022  Start Time:   8 AM End Time:   10 AM  Today's visit was an in person visit that was conducted in my outpatient clinic office.  The patient myself were present for this visit.  1 hour and 15 minutes was spent in face-to-face clinical interview and the other 45 minutes was spent with records review, report writing and setting up testing protocols.  Provider/Observer:  Ilean Skill, Psy.D.       Clinical Neuropsychologist       Billing Code/Service: 96116/96121  Chief Complaint:    Jesus Ward is a 71 year old male who turns 41 tomorrow.  The patient was referred for neuropsychological evaluation by his primary care provider Derinda Late, MD due to family's concern around the patient displaying increasing short-term memory issues.  The patient admits that he is in disagreement with him and does not feel like he is really having any changes in cognitive functioning and continues to effectively perform his demanding job duties efficiently but reports that he is "willing to have formal assessment" to address and answer this question.  While the patient's wife and family were not present for the clinical interview the patient reports that his wife and 2 kids both think that he is having more short-term memory issues and he is willing to address this due to their concerns.  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  He is also now working through issues related to planning for retirement which have also caused him  some personal distress.  The patient has a history of obstructive sleep apnea that was originally diagnosed in the 1990s and has been very diligent about CPAP use.  He continues with insomnia related to difficulty with sleep maintenance.  Patient with past medical history that includes obstructive sleep apnea with excellent compliance of CPAP, insomnia, asthma that is mild and intermittent, dysphonia and history of gastroesophageal reflux, as well as findings consistent with liver function changes and abnormal transaminases, and ANA and anti-smooth muscle weakly positive findings.    Reason for Service:  Jesus Ward is a 71 year old male who turns 7 tomorrow.  The patient was referred for neuropsychological evaluation by his primary care provider Derinda Late, MD due to family's concern around the patient displaying increasing short-term memory issues.  The patient admits that he is in disagreement with him and does not feel like he is really having any changes in cognitive functioning and continues to effectively perform his demanding job duties efficiently but reports that he is "willing to have formal assessment" to address and answer this question.  While the patient's wife and family were not present for the clinical interview the patient reports that his wife and 2 kids both think that he is having more short-term memory issues and he is willing to address this due to their concerns.  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  He is also now working through issues related to planning for retirement which have also caused him some personal distress.  The patient has a history of obstructive sleep apnea that was originally diagnosed in the 1990s and has been very diligent about CPAP use.  He continues with insomnia related to difficulty with sleep maintenance.  Patient with  past medical history that includes obstructive sleep apnea with excellent compliance of CPAP, insomnia, asthma that is mild and intermittent, dysphonia and history of gastroesophageal reflux, and ANA and anti-smooth muscle weakly positive findings.  The patient reports that his wife and adult son and daughter both have been raising concerns about their observations suggesting short-term memory issues that the patient readily disagrees with and reports that he is not "concerned" with memory changes.  The patient denies any geographic disorientation or changes in visual spatial capacity, changes in executive functioning and reasoning problem-solving changes, or changes in attention or other cognitive functions.  While the patient has dystonia he denies any word finding difficulties or verbal fluency changes.    1 particular issue beyond the patient's sleep apnea is his difficulty with maintenance of sleep.  The patient reports that he will sleep between 8 and 10 hours and on some occasions will sleep more than 10 hours.  Patient reports that his CPAP machine tells him that his blood oxygen levels will stay above 92.  The patient reports that he feels rested in the morning when he gets up.  The patient reports that even with medications for sleep including a 2 mg tablet of clonazepam at bedtime that he has difficulty sleeping through the night.  As the patient reports that he does drink alcohol at times in the evening and the clonazepam and these may be playing some role in his difficulty with sleep maintenance.  The patient reports that he does feel a lot of stress particularly when he first gets up in the morning.  The patient has a very demanding job with great responsibility for others financial wellbeing as he works as a Archivist as a Civil engineer, contracting a very Production manager.  The patient is currently taking both Klonopin clonazepam and  and trazodone at night for sleep as well as sertraline to help with mood and mental acuity there are no other psychotropic medications.   The patient does have indications of some autoimmune types of condition although they have been working through an assessment with these issues.  While he is being followed for this with concerns around liver function I was looking through his laboratory reports and was not able to find any lab work related to eosinophils or other indicators of excessive immune system responses beyond IgA studies and studies of his liver and the patient does have a history of treatment for asthmatic type symptoms.  The patient had a very traumatic early childhood.  The patient's mother suffered from significant psychiatric illness and essentially abandoned him very early in life.  His father attempted to commit suicide by shooting himself in the head and suffered severe brain damage and lived in an institution due to severe resulting cognitive deficits.  The patient was sent to an orphanage at an early age and then was homeless around 71 years of age.  The patient has had significant stressors as an adult with his son being diagnosed and treated for leukemia and while he is doing well now this has been a very stressful  time in the sun has experienced significant medical complications from his treatment from leukemia.  Behavioral Observation: Cartrell Bentsen  presents as a 71 y.o.-year-old Right handed Caucasian Male who appeared his stated age. his dress was Appropriate and he was Well Groomed and his manners were Appropriate to the situation.  his participation was indicative of Appropriate and Attentive behaviors.  There were not physical disabilities noted.  he displayed an appropriate level of cooperation and motivation.     Interactions:    Active Appropriate  Attention:   within normal limits and attention span and concentration were age appropriate  Memory:   within normal  limits; recent and remote memory intact  Visuo-spatial:  not examined  Speech (Volume):  normal  Speech:   normal; normal  Thought Process:  Coherent and Relevant  Though Content:  WNL; not suicidal and not homicidal  Orientation:   person, place, time/date, and situation  Judgment:   Good  Planning:   Good  Affect:    Appropriate  Mood:    Dysphoric  Insight:   Good  Intelligence:   very high  Marital Status/Living: The patient was born and raised in [place] with no siblings.  His mother suffered severe psychiatric illness and his father attempted suicide when the patient was very young suffering severe traumatic brain injury and lived in a institution because of his severe cognitive deficits.  The patient was sent to live in an orphanage that was also very stressful for him.  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  The patient is married to his only wife and they have been married for 43 years.  The patient has a 58 year old adult son who dealt with leukemia and is married with 2 children.  The patient is a 3 year old daughter who is dealt with depression, anxiety, OCD, ADD symptoms.  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  The patient has done this job for nearly 52 years.  Hobbies and interests include not only spending time with his own children but also working with at risk children and being active in his church and Bible study.  Substance Use:  The patient denies any history of substance abuse but does acknowledge the use of alcohol at times.  Education:   The patient graduated from [employer] a very good GPA and always did well in his classes.  He engaged in multiple extracurricular activities in high school  including basketball, track, cross-country, band.  In college she participated in the choir and other musical activities as well as intermural basketball.  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  Medical History:   Past Medical History:  Diagnosis Date   Allergic rhinitis    Asthma    Diverticulosis 2007   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   Hearing loss 2010   HSV-1 (herpes simplex virus 1) infection 1999   Hx of colonic polyp 08/20/2016   Hyperlipidemia    IBS (irritable bowel syndrome)    Impaired glucose tolerance    Insomnia    Lactose intolerance    Malaria    1974, 1975   Migraine    Multiple allergies    Nocturnal sleep-related eating disorder  OSA (obstructive sleep apnea)    Pneumonia 0263,7858   Rosacea    Sebaceous hyperplasia    Seborrheic dermatitis    Serrated polyp of colon 2018   Sleep apnea    CPAP    Vitamin D deficiency          Patient Active Problem List   Diagnosis Date Noted   ANA and anti-smooth muscle weakly positive 07/04/2019   NAFLD (nonalcoholic fatty liver disease) 06/29/2019   Abnormal transaminases 04/29/2019   Class 2 severe obesity due to excess calories with serious comorbidity and body mass index (BMI) of 38.0 to 38.9 in adult [facility]) 04/29/2019   Hx of colonic polyp 08/20/2016   Asthma, mild intermittent 05/28/2011   History of gastroesophageal reflux (GERD) 05/28/2011   Dysphonia 04/28/2011   Obstructive sleep apnea 03/07/2011   Insomnia 03/07/2011              Abuse/Trauma History: The patient had a very traumatic and stressful childhood with the abandonment at a young age by his mother because of her psychiatric issues and his father attempting suicide with a gun that produced profound traumatic brain injury.  The patient lived in an orphanage at an early age and was referred essentially homeless by age 9.  Psychiatric History:  No significant past psychiatric history but there is a  psychiatric history with his parents of schizophrenia/bipolar disorder for his mother and depression with his father.  The patient's daughter has dealt with significant anxiety disorder symptoms.  Family Med/Psych History:  Family History  Problem Relation Age of Onset   Schizophrenia Mother        or bipolar disorder   Other Father        brain damage form suicide attempt   Depression Father    Alcohol abuse Father    Anxiety disorder Daughter    Panic disorder Daughter    OCD Daughter    Other Son        ALL in remission, aseptic necrosis of multiple joints    Impression/DX:  Breyson Kelm is a 71 year old male who turns 20 tomorrow.  The patient was referred for neuropsychological evaluation by his primary care provider Derinda Late, MD due to family's concern around the patient displaying increasing short-term memory issues.  The patient admits that he is in disagreement with him and does not feel like he is really having any changes in cognitive functioning and continues to effectively perform his demanding job duties efficiently but reports that he is "willing to have formal assessment" to address and answer this question.  While the patient's wife and family were not present for the clinical interview the patient reports that his wife and 2 kids both think that he is having more short-term memory issues and he is willing to address this due to their concerns.  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  He is also now working through issues related to planning for retirement which have also caused him some personal distress.  The patient has a history of obstructive sleep apnea that was originally diagnosed in the 1990s and has been very diligent about CPAP use.  He continues with insomnia related to difficulty with sleep maintenance.  Patient with past medical  history that includes obstructive sleep apnea with excellent compliance of CPAP, insomnia, asthma that is mild and intermittent, dysphonia and history  of gastroesophageal reflux, as well as findings consistent with liver function changes and abnormal transaminases, and ANA and anti-smooth muscle weakly positive findings.   Disposition/Plan:  We have set the patient up for formal neuropsychological assessment and will conduct a foundational battery including the Wechsler Adult Intelligence Scale and the Wechsler Memory Scale's.  Once this is completed a determination will be made as to any potential need for other testing although I think this will likely suffice.  I do have some recommendations that I would suggest to the patient currently including these noted below.  Patient has a history of insomnia and difficulty sleeping through the night.  He has been diagnosed with obstructive sleep apnea and diligently uses a CPAP machine.  Even with prescribed sleep aids he reports that he rarely sleeps through the night.  The patient does admit to using some alcohol and I would suggest initially that he consider stopping alcohol as his pattern of waking in the night and difficulty maintenance of sleep can be impacted with alcohol use.  Diagnosis:    Subjective memory complaints  Obstructive sleep apnea  Primary insomnia         Electronically Signed   _______________________ Ilean Skill, Psy.D. Clinical Neuropsychologist

## 2022-05-01 ENCOUNTER — Encounter: Payer: No Typology Code available for payment source | Attending: Psychology

## 2022-05-01 DIAGNOSIS — F5101 Primary insomnia: Secondary | ICD-10-CM | POA: Diagnosis present

## 2022-05-01 DIAGNOSIS — G4733 Obstructive sleep apnea (adult) (pediatric): Secondary | ICD-10-CM | POA: Insufficient documentation

## 2022-05-01 DIAGNOSIS — R4189 Other symptoms and signs involving cognitive functions and awareness: Secondary | ICD-10-CM | POA: Insufficient documentation

## 2022-05-01 NOTE — Progress Notes (Signed)
Behavioral Observations  The patient appeared well-groomed and appropriately dressed. His manners were polite and appropriate to the situation. The patient demonstrated a positive attitude toward testing and showed good effort.  Neuropsychology Note  Jesus Ward completed 180 minutes of neuropsychological testing with technician, Dina Rich, BA, under the supervision of Ilean Skill, PsyD., Clinical Neuropsychologist. The patient did not appear overtly distressed by the testing session, per behavioral observation or via self-report to the technician. Rest breaks were offered.   Clinical Decision Making: In considering the patient's current level of functioning, level of presumed impairment, nature of symptoms, emotional and behavioral responses during clinical interview, level of literacy, and observed level of motivation/effort, a battery of tests was selected by Dr. Sima Matas during initial consultation on 04/02/2022. This was communicated to the technician. Communication between the neuropsychologist and technician was ongoing throughout the testing session and changes were made as deemed necessary based on patient performance on testing, technician observations and additional pertinent factors such as those listed above.  Tests Administered: Wechsler Adult Intelligence Scale, 4th Edition (WAIS-IV) Wechsler Memory Scale, 4th Edition (WMS-IV); Older Adult Battery   Results:  WAIS-IV  Composite Score Summary  Scale Sum of Scaled Scores Composite Score Percentile Rank 95% Conf. Interval Qualitative Description  Verbal Comprehension 35 VCI 108 70 102-113 Average  Perceptual Reasoning 21 PRI 82 12 77-89 Low Average  Working Memory 20 WMI 100 50 93-107 Average  Processing Speed 14 PSI 84 14 77-94 Low Average  Full Scale 90 FSIQ 93 32 89-97 Average  General Ability 56 GAI 96 39 91-101 Average        Verbal Comprehension Subtests Summary  Subtest Raw Score Scaled  Score Percentile Rank Reference Group Scaled Score SEM  Similarities 29 12 75 12 1.04  Vocabulary 46 12 75 13 0.67  Information 16 11 63 12 0.73  (Comprehension) 28 12 75 12 1.08        Perceptual Reasoning Subtests Summary  Subtest Raw Score Scaled Score Percentile Rank Reference Group Scaled Score SEM  Block Design 28 9 37 6 1.08  Matrix Reasoning '6 5 5 3 '$ 0.90  Visual Puzzles '7 7 16 5 '$ 0.85  (Figure Weights) '7 7 16 5 '$ 0.95  (Picture Completion) '6 6 9 5 '$ 1.16        Working Doctor, general practice Raw Score Scaled Score Percentile Rank Reference Group Scaled Score SEM  Digit Span 24 9 37 8 0.79  Arithmetic 15 11 63 11 0.99  (Letter-Number Seq.) '16 8 25 7 '$ 1.04        Processing Speed Subtests Summary  Subtest Raw Score Scaled Score Percentile Rank Reference Group Scaled Score SEM  Symbol Search '18 7 16 5 '$ 1.31  Coding 38 '7 16 4 '$ 0.99  (Cancellation) '29 8 25 6 '$ 1.34         WMS-IV  Index Score Summary  Index Sum of Scaled Scores Index Score Percentile Rank 95% Confidence Interval Qualitative Descriptor  Auditory Memory (AMI) 48 112 79 105-118 High Average  Visual Memory (VMI) 21 102 55 97-107 Average  Immediate Memory (IMI) 34 108 70 101-114 Average  Delayed Memory (DMI) 35 109 73 101-116 Average       Primary Subtest Scaled Score Summary  Subtest Domain Raw Score Scaled Score Percentile Rank  Logical Memory I AM 39 13 84  Logical Memory II AM 22 12 75  Verbal [facility] [facility] I AM 23 11 63  Verbal [facility] II AM  8 12 75  Visual Reproduction I VM 31 10 50  Visual Reproduction II VM 23 11 63  Symbol Span VWM 20 10 50       Auditory Memory Process Score Summary  Process Score Raw Score Scaled Score Percentile Rank Cumulative Percentage (Base Rate)  LM II Recognition 20 - - 51-75%  VPA II Recognition 29 - - 51-75%        Visual Memory Process Score Summary  Process Score Raw Score Scaled Score Percentile Rank Cumulative  Percentage (Base Rate)  VR II Recognition 4 - - 26-50%       ABILITY-MEMORY ANALYSIS  Ability Score:  VCI: 108 Date of Testing:  WAIS-IV; WMS-IV 2022/05/01  Predicted Difference Method   Index Predicted WMS-IV Index Score Actual WMS-IV Index Score Difference Critical Value  Significant Difference Y/N Base Rate  Auditory Memory 104 112 -8 9.51 N   Visual Memory 104 102 2 8.07 N   Immediate Memory 105 108 -3 10.30 N   Delayed Memory 104 109 -5 11.51 N   Statistical significance (critical value) at the .01 level.          Feedback to Patient: Jesus Ward will return on 07/31/2021 for an interactive feedback session with Dr. Sima Matas at which time his test performances, clinical impressions and treatment recommendations will be reviewed in detail. The patient understands he can contact our office should he require our assistance before this time.  180 minutes spent face-to-face with patient administering standardized tests, 30 minutes spent scoring Environmental education officer). [CPT [id number], [id number]]  Full report to follow.

## 2022-05-12 ENCOUNTER — Telehealth: Payer: Self-pay | Admitting: Psychology

## 2022-05-12 NOTE — Telephone Encounter (Signed)
Patient received a message from our practice via mychart on Friday but is not able to see message. Please follow up with patient.

## 2022-05-22 ENCOUNTER — Encounter (HOSPITAL_BASED_OUTPATIENT_CLINIC_OR_DEPARTMENT_OTHER): Payer: No Typology Code available for payment source | Admitting: Psychology

## 2022-05-22 ENCOUNTER — Encounter: Payer: Self-pay | Admitting: Psychology

## 2022-05-22 DIAGNOSIS — R4189 Other symptoms and signs involving cognitive functions and awareness: Secondary | ICD-10-CM

## 2022-05-22 DIAGNOSIS — G4733 Obstructive sleep apnea (adult) (pediatric): Secondary | ICD-10-CM | POA: Diagnosis not present

## 2022-05-22 DIAGNOSIS — F5101 Primary insomnia: Secondary | ICD-10-CM | POA: Diagnosis not present

## 2022-05-22 NOTE — Progress Notes (Addendum)
Neuropsychological Evaluation   Patient:  Jesus Ward   DOB: 29-Sep-1950  MR Number: [id number]  Location: [facility] FOR PAIN AND [facility] MEDICINE [facility] PHYSICAL MEDICINE AND REHABILITATION [place], [id number] [place] [id number] Dept: [phone]  Start: 3 PM End: 4 PM  Provider/Observer:     Edgardo Roys PsyD  Chief Complaint:      Chief Complaint  Patient presents with   Memory Loss    Reason For Service:     Jesus Ward is a 71 year old male referred for neuropsychological evaluation by his primary care provider Derinda Late, MD due to family's concern around the patient displaying increasing short-term memory issues.  The patient admits that he is in disagreement with them and does not feel like he is really having any changes in cognitive functioning and continues to effectively perform his demanding job duties efficiently but reports that he is "willing to have formal assessment" to address and answer this question.  While the patient's wife and family were not present for the clinical interview, the patient reports that his wife and 2 kids both think that he is having more short-term memory issues and he is willing to address this due to their concerns.  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  He is also now working through issues related to planning for retirement, which have also caused him some personal distress.  The patient has a history of obstructive sleep apnea that was originally diagnosed in the 1990s and has been very diligent about CPAP use.  He continues with insomnia related to difficulty with sleep maintenance.  Patient with past medical history that includes obstructive sleep apnea with excellent compliance of CPAP, insomnia, asthma that is mild and intermittent,  dysphonia and history of gastroesophageal reflux, and ANA and anti-smooth muscle weakly positive findings.   The patient reports that his wife and adult son and daughter both have been raising concerns about their observations suggesting short-term memory issues that the patient readily disagrees with and reports that he is not "concerned" with memory changes.  The patient denies any geographic disorientation or changes in visual spatial capacity, changes in executive functioning and reasoning problem-solving changes, or changes in attention or other cognitive functions.  While the patient has dysphonia he denies any word finding difficulties or verbal fluency changes.     One particular issue beyond the patient's sleep apnea, is his difficulty with maintenance of sleep.  The patient reports that he will sleep between 8 and 10 hours and on some occasions will sleep more than 10 hours.  Patient reports that his CPAP machine tells him that his blood oxygen levels will stay above 92.  The patient reports that he feels rested in the morning when he gets up.  The patient reports that even with medications for sleep including a 2 mg tablet of clonazepam at bedtime that he has difficulty sleeping through the night.  As the patient reports that he does drink alcohol at times in the evening and the clonazepam and these may be playing some role in his difficulty with sleep maintenance.   The patient reports that he feels a lot of stress, particularly when he first gets up in the morning.  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  The patient is currently taking both Klonopin clonazepam and and trazodone at night for sleep as well as sertraline to help with mood and mental acuity there are no other psychotropic medications.    The patient has indications of some autoimmune types of condition although they have been working through an assessment with these issues.  While he is being followed for this with concerns around liver function I was looking through his laboratory reports and was not able to find any lab work related to eosinophils or other indicators of excessive immune system responses beyond IgA studies and studies of his liver and the patient does have a history of treatment for asthmatic type symptoms.   The patient had a very traumatic early childhood.  The patient's mother suffered from significant psychiatric illness and essentially abandoned him very early in life.  His father attempted to commit suicide by shooting himself in the head and suffered severe brain damage and lived in an institution due to severe resulting cognitive deficits.  The patient was sent to an orphanage at an early age and then was homeless around 71 years of age.  The patient has had significant stressors as an adult with his son being diagnosed and treated for leukemia and while he is doing well now this has been a very stressful time in the sun has experienced significant medical complications from his treatment from leukemia.  Tests Administered: Wechsler Adult Intelligence Scale, 4th Edition (WAIS-IV) Wechsler Memory Scale, 4th Edition (WMS-IV); Older Adult Battery  Participation Level:   Active  Participation Quality:  Appropriate      Behavioral Observation:  The patient appeared well-groomed and appropriately dressed. His manners were polite and appropriate to the situation. The patient demonstrated a positive attitude toward testing and showed good effort.   Well Groomed, Alert, and Appropriate.   Test Results:   The patient appeared to approach the assessment in an honest and straightforward manner neither attempting to exaggerate or minimize any cognitive symptoms.  The patient tried his hardest throughout the assessment  and this does appear to be a fair and valid assessment of his current cognitive functioning.  Initially, an estimation was made as to the patient's premorbid intellectual and cognitive functioning status.  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  It is estimated conservatively that the patient has been operated in the high average range at least with some individual cognitive areas likely to have been in the superior range relative to a normative population.  WAIS-IV             Composite Score Summary         Scale Sum of Scaled Scores Composite Score Percentile Rank 95% Conf. Interval Qualitative Description  Verbal Comprehension 35 VCI 108 70 102-113 Average  Perceptual Reasoning 21 PRI 82 12 77-89 Low Average  Working Memory 20 WMI 100 50 93-107 Average  Processing Speed 14 PSI 84 14 77-94 Low Average  Full Scale 90 FSIQ 93 32 89-97 Average  General Ability 56 GAI 96 39 91-101 Average      Initially, the patient was administered the Wechsler Adult Intelligence Scale-IV to provide a thorough and standardized assessment of a wide range of varying cognitive domains.  While the patient denies personally feeling like he is having any changes in cognitive or memory functions it is reported by his family that there have been some changes.  The current results should not be used to describe the patient's lifelong functioning but more reflexive of his current status in various cognitive domains.  2 global composite scores were calculated as part of this assessment.  The patient produced a full-scale IQ score of 93 which fell at the 32nd percentile and was in the lower end of the average range.  This is significantly below estimates of premorbid functioning and does suggest 1 or more areas of cognitive  change.  We also calculated the patient's general abilities index score which places less emphasis on elements that are the most sensitive to change including information processing speed/focus execute abilities as well as auditory encoding types of capacity.  As 1 of these 2 areas was indicative of the most change he showed some improvement on the general abilities index producing a composite score of 96 which fell at the 39th percentile and in the average range.  This also is roughly 1 standard deviation or more below predicted levels of premorbid functioning.               Verbal Comprehension Subtests Summary     Subtest Raw Score Scaled Score Percentile Rank Reference Group Scaled Score SEM  Similarities 29 12 75 12 1.04  Vocabulary 46 12 75 13 0.67  Information 16 11 63 12 0.73  (Comprehension) 28 12 75 12 1.08      The patient produced a verbal comprehension index score of 108 which falls at the [facility] and is in the upper end of the average range.  This is closer to estimates of premorbid functioning but still somewhat below those predictions.  There was little scatter and subtest performance consistently performing around the 75th percentile relative to a normative population.  There were no indications of focal or specific deficits and the patient did well on measures of verbal reasoning and problem-solving, vocabulary knowledge, his general fund of information as well as social judgment comprehension variables.             Perceptual Reasoning Subtests Summary     Subtest Raw Score Scaled Score Percentile Rank Reference Group Scaled Score SEM  Block Design 28 9 37 6 1.08  Matrix Reasoning '6 5 5 3 '$ 0.90  Visual Puzzles '7 7 16 5 '$ 0.85  (Figure Weights) '7 7 16 5 '$ 0.95  (Picture Completion) '6 6 9 5 '$ 1.16      The patient produced a perceptual reasoning index score of 82 which falls at the 12th percentile and is in the low average range relative to a normative population.  This is  easily more than 2 standard deviations below is likely premorbid functioning levels.  The patient performed in the average range on measures of visual analysis and organization but showed mild to moderate impairments on measures of visual reasoning and problem-solving, visual estimation and prediction capacity as well as his ability to identify visual anomalies within a visual gestalt.  This would strongly suggest difficulties with visual spatial and visual constructional components as well as executive functions around visual reasoning and problem-solving in contrast to his well-maintained verbal reasoning and problem-solving capacity.             Working Electrical engineer Raw Score Scaled Score Percentile Rank Reference Group Scaled Score SEM  Digit Span 24 9 37 8 0.79  Arithmetic 15 11 63 11 0.99  (Letter-Number Seq.) '16 8 25 7 '$ 1.04      The patient produced a working memory index score of 100 which falls at the 50th percentile and is in the average range.  While this is below predicted levels of premorbid functioning is still in the average range and not clearly indicative of any specific cognitive deficit.  The patient did well on measures of auditory encoding and his ability to process information in his active register.               Processing Speed Subtests Summary      Subtest Raw Score Scaled Score Percentile Rank Reference Group Scaled Score SEM  Symbol Search '18 7 16 5 '$ 1.31  Coding 38 '7 16 4 '$ 0.99  (Cancellation) '29 8 25 6 '$ 1.34      The patient produced a processing speed index score of 84 which falls at the 14th percentile and is in the low average range relative to a normative population.  Little to no scatter was noted on subtest performance and his performances were well below those predicted by estimations of premorbid functioning levels.  The patient showed mild deficits relative to a normative population and moderate deficits relative to premorbid  estimates on measures requiring visual scanning, visual searching and overall speed of mental operations (focus execute abilities).         WMS-IV           Index Score Summary       Index Sum of Scaled Scores Index Score Percentile Rank 95% Confidence Interval Qualitative Descriptor  Auditory Memory (AMI) 48 112 79 105-118 High Average  Visual Memory (VMI) 21 102 55 97-107 Average  Immediate Memory (IMI) 34 108 70 101-114 Average  Delayed Memory (DMI) 35 109 73 101-116 Average      The patient was then administered the Wechsler Memory Scale-IV to provide a thorough assessment of a wide range of memory and learning functions.  On the Wechsler Adult Intelligence Scale the patient performed in the average range on measures of auditory working memory and he performed an almost identical level of performance on measures of visual encoding both of which produced scaled scores in the average range relative to a normative population.  There is no significant difference between auditory versus visual encoding components and the level of performance should not produce any significant impediment to his capacity to store, organize and remember newly learned information.  Breaking memory functions down between auditory versus visual memory the patient produced an auditory memory index score of 112 following at the 79th percentile and in the high average range relative to a normative population.  This is only slightly below predicted levels of premorbid functioning and does not suggest any objective findings of his capacity to store and organize new information at least with regard to auditory information.  The patient performed slightly below that level on visual memory functions but he still performed in the average range producing a visual memory index score of 102 and following at the 55th percentile relative to a normative population.  There was a slight difference between auditory versus visual memory  and given the fact that he is having significant difficulties related to visual spatial and visual processing this may explain some of the relative weakness for visual memory components.  Breaking memory functions down between immediate versus delayed the patient produced an immediate memory index score  of 108 which falls at the [facility] and is in the average range.  He produced a similar level of performance on delayed memory functions with a delayed memory index score of 109 following at the 73rd percentile relative to a normative population.  This pattern suggest that information that is initially encoded, stored and organized is available for recall after period of delay.  There is no significant loss of information between immediate recall and delayed recall.  The patient was lastly placed in a recognition/cued recall challenge for information that he had previously been presented with.  The patient did well under recognition/cued recall formats but he also did fairly well in free recall formats.  Overall, his pattern of memory and learning for both auditory and visual information does not suggest any specific or significant deficits and he actually performed better than normative population on these measures which was predicted by premorbid estimate models.         Primary Subtest Scaled Score Summary    Subtest Domain Raw Score Scaled Score Percentile Rank  Logical Memory I AM 39 13 84  Logical Memory II AM 22 12 75  Verbal [employer] I AM 23 11 63  Verbal [facility] II AM 8 12 75  Visual Reproduction I VM 31 10 50  Visual Reproduction II VM 23 11 63  Symbol Span VWM 20 10 50            Auditory Memory Process Score Summary      Process Score Raw Score Scaled Score Percentile Rank Cumulative Percentage (Base Rate)  LM II Recognition 20 - - 51-75%  VPA II Recognition 29 - - 51-75%             Visual Memory Process Score Summary      Process Score Raw Score Scaled  Score Percentile Rank Cumulative Percentage (Base Rate)  VR II Recognition 4 - - 26-50%      Summary of Results:   The results of the current neuropsychological evaluation due to show 2 specific areas of cognitive functioning that are significantly below estimates of premorbid functioning.  First area had to do with visual spatial and visual constructional capacity.  While the patient shows generally average performance with regard to his visual analysis and organizational capacity he shows some mild to moderate deficits relative to a normative population and significantly below premorbid estimates of functioning on measures of visual reasoning and problem-solving, visual estimation and prediction, and his ability to identify visual and aldolase within a visual gestalt.   The second area identified as objective findings showing deficits had to do with specific weaknesses for focus execute abilities and overall information processing speed specifically with visual processing speed.   Visual reasoning and problem-solving deficits and information processing speed/focus execute abilities were the primary areas of deficits noted.  While the patient performs somewhat below predicted levels of premorbid predictions, he still performed in the average range relative to a normative population with regard to  primary verbal based skills including verbal reasoning and  problem solving, vocabulary knowledge, general fund of information and social judgment comprehension.  As far as memory and learning, the patient's auditory and visual encoding appear to be intact and this does not appear to be a primary attentional component other than slowed information processing speed/focus execute aspect of the deficits.  The patient was able to to adequately encode and store new information that was both auditory and visual in nature.  He also  showed the capacity to store that information over a period of delay and retrieve the  information in both the free recall format as well as cued recall format.  While this performance was somewhat below predicted levels of premorbid functioning, they were still within normal limits and no clear indications of cognitive deficits were noted.  Impression/Diagnosis:     Overall, the results of the current neuropsychological evaluation are generally encouraging with regard to memory functions.  There does not appear to be significant memory deficits per se.  However, there were some identified deficits that are likely to represent significant changes from premorbid functioning.  These primarily have to do with visual reason and problem-solving and information processing speed/focus execute attentional abilities.  I suspect that these changes in information processing speed are likely playing a primary role and the subjective/observational reports of his family.  The changes identified in visual reasoning and problem-solving are some more concerning findings, although these types of measures also have a time-based component to them and may further reflect information processing speed deficits than a primary deficit in visual reasoning and problem-solving.  As far as the agnostic considerations, this pattern is not particularly consistent with  progressive cortical dementia such as Alzheimer's or Lewy body type conditions.  They could however, represent some subcortical changes, although we have no brain imaging results from the past 6 years with his last brain MRI conducted on 12/11/20202017.  The pattern of cognitive strengths and weaknesses would be consistent with more extensive changes in white matter tracts from those found in 2017.  The types of cognitive deficits that were objectively assessed would clearly have an impact on the patient's efficiencies and learning new information in real world settings..  His symptoms are not consistent with acute vascular event such as strokes or hemorrhagic  bleeds and the patient's concerns have been  primarily around concerns of memory changes.  The patient does have a diagnosis of dysphonia with suggest the possibility of some underlying neurological changes, although there are lots of potential etiological factors around this diagnosis. The patient denies any significant change or disturbance in mood status and denies significant depression or anxiety(Note added on 07/31/2022:  Patient reported during feedback visit that after thought he did feel like he is experencing some depressive symptoms and Dr. Sandi Mariscal has started Wellbutrin).  Patient is remaining active in both his occupational efforts as well as his outside work  Nurse, learning disability.  As far as treatment recommendations, it would be appropriate to perform repeat testing in 9 to 12 months to assess for any progressive changes as clear diagnostic considerations are not readily available from the recurrent results.  The patient does have a history of significant sleep disturbance with obstructive sleep apnea but this is being effectively managed with good compliance with CPAP.  The patient is taking  clonazepam at bedtime to help with onset of sleep, which could be playing a role in some of his memory issues as this is a potential side defect of such medications.  He would also be very important for the patient to make sure he is not having any alcohol at bedtime in particular as both alcohol and clonazepam can have a negative impact on quality of sleep even though they may aid in onset of sleep.    It may also be worthwhile looking at other strategies to help with his sleep pattern outside of benzodiazepine type medications.As always, good nutrition and regular physical activity will be important.  It may  be worthwhile having a new MRI done to look for the possibility of microvascular ischemia manges and white matter tracts is that possibility cannot be ruled out with the current testing.  Also, review of  depressive symptoms on 07/31/2022 suggest that he may also respond to an SSRI strategy as well.  I will sit down with the patient and go over the results of the current neuropsychological evaluation with recommendations and answer any questions he or his family may have.  Diagnosis:    Subjective memory complaints  Obstructive sleep apnea  Primary insomnia   _____________________ Ilean Skill, Psy.D. Clinical Neuropsychologist

## 2022-06-09 ENCOUNTER — Ambulatory Visit: Payer: No Typology Code available for payment source | Attending: Family Medicine

## 2022-06-09 ENCOUNTER — Other Ambulatory Visit: Payer: Self-pay | Admitting: *Deleted

## 2022-06-09 DIAGNOSIS — R42 Dizziness and giddiness: Secondary | ICD-10-CM

## 2022-06-09 NOTE — Progress Notes (Unsigned)
Enrolled for [employer] to mail a ZIO XT long term holter monitor to the patients address on file.   Results to Dr. Derinda Late.  Fax [phone].

## 2022-06-24 ENCOUNTER — Telehealth: Payer: Self-pay | Admitting: Psychology

## 2022-06-24 NOTE — Telephone Encounter (Signed)
Can he print out from [facility] his notes to share with his wife and review prior to his appt on 2/3. Please advise

## 2022-06-25 ENCOUNTER — Ambulatory Visit: Payer: No Typology Code available for payment source | Admitting: Adult Health

## 2022-06-25 ENCOUNTER — Other Ambulatory Visit: Payer: Self-pay | Admitting: *Deleted

## 2022-06-25 ENCOUNTER — Encounter (INDEPENDENT_AMBULATORY_CARE_PROVIDER_SITE_OTHER): Payer: Self-pay | Admitting: Family Medicine

## 2022-06-25 ENCOUNTER — Ambulatory Visit: Payer: No Typology Code available for payment source

## 2022-06-25 ENCOUNTER — Ambulatory Visit (INDEPENDENT_AMBULATORY_CARE_PROVIDER_SITE_OTHER): Payer: No Typology Code available for payment source

## 2022-06-25 ENCOUNTER — Encounter: Payer: Self-pay | Admitting: Adult Health

## 2022-06-25 VITALS — BP 112/80 | HR 88 | Temp 98.2°F | Ht 70.0 in | Wt 235.0 lb

## 2022-06-25 DIAGNOSIS — J4520 Mild intermittent asthma, uncomplicated: Secondary | ICD-10-CM

## 2022-06-25 DIAGNOSIS — G4733 Obstructive sleep apnea (adult) (pediatric): Secondary | ICD-10-CM

## 2022-06-25 DIAGNOSIS — F5101 Primary insomnia: Secondary | ICD-10-CM

## 2022-06-25 DIAGNOSIS — R42 Dizziness and giddiness: Secondary | ICD-10-CM

## 2022-06-25 NOTE — Progress Notes (Signed)
$'@Patient'E$  ID: Jesus Ward, male    DOB: 02/04/1951, 72 y.o.   MRN: [id number]  Chief Complaint  Patient presents with   Follow-up    Referring provider: Derinda Late, MD  HPI: 72 yo male never smoker followed for OSA and Asthma  Medial history significant for chronic sinusitis and gerd  TEST/EVENTS :  PFT 10/2019 Normal -FEV1 106%, ratio 80, FVC 98%, no BD response, DLCO  117%  06/25/2022 Follow up : OSA and Asthma  Patient returns for 4 month follow up .  Patient has underlying sleep apnea and is on CPAP at bedtime  CPAP download shows excellent compliance with daily average usage at 9 hours.  Patient is on auto CPAP 5 to 20 cm H2O.  AHI 1.0/hour.  Daily average pressure at 9.0 cm H2O.  Complains that recently he has been having significant trouble sleeping.  He is waking up frequently.  Taking 20 minutes up to hours to go back to sleep.  He has been snoring a little bit.  Says he never misses his CPAP.  He has to sleep with it.  He has been following with his primary care provider and is now taking Klonopin 2 mg at bedtime trazodone 50 mg at bedtime.  Patient says likes been a little stressful lately.  Has also started on Wellbutrin for depression like symptoms.  Sort of feels like Wellbutrin might be interrupting his sleep but is not sure yet.  He is getting near retirement which he is not looking forward to.  Has set up some counseling to see if he will help as well.  Patient says he is very active.  Works out at least 2-3 times a week uses a treadmill and weights at home.  Typically goes to bed about 9:30 PM.  Can take up to an hour to go to sleep.  Gets up anywhere between 7 and 9 AM.  Typically got up a lot earlier in the past.  Is spending around 10 or more hours in the bed. Typically does not nap during the week will nap on the weekends some. Patient did have a episode of weakness the week of Christmas.  His primary care provider is doing a cardiac workup and is referred to  cardiology.  He is currently wearing a Zio patch.  Patient says breathing is doing okay.  He recently started back on his Advair he denies any increased cough or shortness of breath.  No increased albuterol use.        Allergies  Allergen Reactions   Ceftin [Cefuroxime Axetil] Nausea Only   Penicillins     Unknown- had reaction as a child    Immunization History  Administered Date(s) Administered   Hepatitis A 12/07/2009, 06/21/2010   Hepatitis B 12/07/2009, 06/21/2010, 10/07/2010   Influenza Split 03/23/2013   Influenza Whole 06/22/2018   Moderna Sars-Covid-2 Vaccination 09/02/2019, 09/30/2019   Pneumococcal Conjugate-13 05/14/2016   Pneumococcal Polysaccharide-23 05/06/2013   Tdap 10/19/2017   Zoster Recombinat (Shingrix) 09/11/2017, 01/01/2018    Past Medical History:  Diagnosis Date   Allergic rhinitis    Asthma    Diverticulosis 2007   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   Hearing loss 2010   HSV-1 (herpes simplex virus 1) infection 1999   Hx of colonic polyp 08/20/2016   Hyperlipidemia    IBS (irritable bowel syndrome)    Impaired glucose tolerance    Insomnia  Lactose intolerance    Malaria    1974, 1975   Migraine    Multiple allergies    Nocturnal sleep-related eating disorder    OSA (obstructive sleep apnea)    Pneumonia 6283,1517   Rosacea    Sebaceous hyperplasia    Seborrheic dermatitis    Serrated polyp of colon 2018   Sleep apnea    CPAP    Vitamin D deficiency     Tobacco History: Social History   Tobacco Use  Smoking Status Never  Smokeless Tobacco Never   Counseling given: Not Answered   Outpatient Medications Prior to Visit  Medication Sig Dispense Refill   acetaminophen (TYLENOL) 325 MG tablet Take 500 mg by mouth every 6 (six) hours as needed for mild pain.     anastrozole (ARIMIDEX) 1 MG tablet Take 1 mg by mouth daily.     buPROPion (WELLBUTRIN XL) 150 MG 24 hr tablet Take 150 mg by  mouth every morning.     albuterol (PROAIR HFA) 108 (90 Base) MCG/ACT inhaler Inhale into the lungs every 6 (six) hours as needed for wheezing or shortness of breath.     betamethasone dipropionate 0.05 % lotion Apply topically as needed.     cetirizine (ZYRTEC) 10 MG tablet Take 10 mg by mouth daily.     Cholecalciferol (VITAMIN D-3) 1000 UNITS CAPS Take 2 capsules by mouth daily.      clonazePAM (KLONOPIN) 2 MG tablet Take 2 mg by mouth at bedtime as needed.  1   ezetimibe (ZETIA) 10 MG tablet Take 10 mg by mouth daily.     Fluticasone-Salmeterol (ADVAIR) 250-50 MCG/DOSE AEPB Inhale 1 puff into the lungs 2 (two) times daily as needed.     hydrocortisone 2.5 % cream Apply topically 2 (two) times daily.     rosuvastatin (CRESTOR) 40 MG tablet Take 40 mg by mouth daily.     SUMAtriptan (IMITREX) 100 MG tablet Take 100 mg by mouth as needed.     topiramate (TOPAMAX) 100 MG tablet Take 100 mg by mouth at bedtime.     topiramate (TOPAMAX) 50 MG tablet Take 50 mg by mouth at bedtime.     aspirin 81 MG tablet Take 81 mg by mouth daily. (Patient not taking: Reported on 06/25/2022)     simvastatin (ZOCOR) 40 MG tablet Take 40 mg by mouth daily. (Patient not taking: Reported on 06/25/2022)     testosterone (ANDROGEL) 50 MG/5GM (1%) GEL Place 5 g onto the skin daily.     tretinoin (RETIN-A) 0.1 % cream Apply topically at bedtime. (Patient not taking: Reported on 06/25/2022)     No facility-administered medications prior to visit.     Review of Systems:   Constitutional:   No  weight loss, night sweats,  Fevers, chills,  +fatigue, or  lassitude.  HEENT:   No headaches,  Difficulty swallowing,  Tooth/dental problems, or  Sore throat,                No sneezing, itching, ear ache, nasal congestion, post nasal drip,   CV:  No chest pain,  Orthopnea, PND, swelling in lower extremities, anasarca, dizziness, palpitations, syncope.   GI  No heartburn, indigestion, abdominal pain, nausea, vomiting, diarrhea,  change in bowel habits, loss of appetite, bloody stools.   Resp: No shortness of breath with exertion or at rest.  No excess mucus, no productive cough,  No non-productive cough,  No coughing up of blood.  No change in color of  mucus.  No wheezing.  No chest wall deformity  Skin: no rash or lesions.  GU: no dysuria, change in color of urine, no urgency or frequency.  No flank pain, no hematuria   MS:  No joint pain or swelling.  No decreased range of motion.  No back pain.    Physical Exam    GEN: A/Ox3; pleasant , NAD, well nourished    HEENT:  [place]/AT,  EACs-clear, TMs-wnl, NOSE-clear, THROAT-clear, no lesions, no postnasal drip or exudate noted.   NECK:  Supple w/ fair ROM; no JVD; normal carotid impulses w/o bruits; no thyromegaly or nodules palpated; no lymphadenopathy.    RESP  Clear  P & A; w/o, wheezes/ rales/ or rhonchi. no accessory muscle use, no dullness to percussion  CARD:  RRR, no m/r/g, no peripheral edema, pulses intact, no cyanosis or clubbing.  GI:   Soft & nt; nml bowel sounds; no organomegaly or masses detected.   Musco: Warm bil, no deformities or joint swelling noted.   Neuro: alert, no focal deficits noted.    Skin: Warm, no lesions or rashes    Lab Results:  CBC No results found for: "WBC", "RBC", "HGB", "HCT", "PLT", "MCV", "MCH", "MCHC", "RDW", "LYMPHSABS", "MONOABS", "EOSABS", "BASOSABS"  BMET No results found for: "NA", "K", "CL", "CO2", "GLUCOSE", "BUN", "CREATININE", "CALCIUM", "GFRNONAA", "GFRAA"  BNP No results found for: "BNP"  ProBNP No results found for: "PROBNP"  Imaging: No results found.       Latest Ref Rng & Units 11/11/2019   11:53 AM  PFT Results  FVC-Pre L 4.25   FVC-Predicted Pre % 92   FVC-Post L 4.50   FVC-Predicted Post % 98   Pre FEV1/FVC % % 80   Post FEV1/FCV % % 80   FEV1-Pre L 3.39   FEV1-Predicted Pre % 99   FEV1-Post L 3.61   DLCO uncorrected ml/min/mmHg 31.35   DLCO UNC% % 117   DLCO corrected  ml/min/mmHg 31.35   DLCO COR %Predicted % 117   DLVA Predicted % 122   TLC L 6.55   TLC % Predicted % 93   RV % Predicted % 94     No results found for: "NITRICOXIDE"      Assessment & Plan:   No problem-specific Assessment & Plan notes found for this encounter.     Rexene Edison, NP 06/25/2022

## 2022-06-25 NOTE — Progress Notes (Unsigned)
Enrolled for [employer] to mail a ZIO AT Live Telemetry monitor to patients address on file.   DOD to read.  Fax results to Dr. Sandi Mariscal at [phone].

## 2022-06-25 NOTE — Patient Instructions (Addendum)
Chest xray today  CPAP download .  Healthy sleep regimen .  Continue on CPAP At bedtime, wear each night all night long .  Work on healthy weight  Do not drive if sleepy  Continue on Advair Twice daily  , rinse after use Albuterol inhaler As needed   Follow up with Dr. Annamaria Boots  in 3 months and As needed  Please contact office for sooner follow up if symptoms do not improve or worsen or seek emergency care

## 2022-06-27 ENCOUNTER — Telehealth: Payer: Self-pay | Admitting: Radiology

## 2022-06-27 NOTE — Assessment & Plan Note (Signed)
Currently appears to be controlled.  Continue on current regimen

## 2022-06-27 NOTE — Assessment & Plan Note (Signed)
Excellent control and compliance on CPAP.  Continue current settings.  Plan  Patient Instructions  Chest xray today  CPAP download .  Healthy sleep regimen .  Continue on CPAP At bedtime, wear each night all night long .  Work on healthy weight  Do not drive if sleepy  Continue on Advair Twice daily  , rinse after use Albuterol inhaler As needed   Follow up with Dr. Annamaria Boots  in 3 months and As needed  Please contact office for sooner follow up if symptoms do not improve or worsen or seek emergency care

## 2022-06-27 NOTE — Assessment & Plan Note (Signed)
Insomnia suspect is multifactorial in nature.  Patient is already taken Klonopin and trazodone.  Will continue with this regimen.  Continue to set up for counseling.  Also management of depression may help as well.  May need to consider changing Wellbutrin if sleep continues to worsen while taking.  Although doubtful Wellbutrin is contributing. Healthy sleep regimen discussed.  Discussed setting a more routine sleep pattern with 8 hours of sleep time in the bed.  Seeing if getting up earlier may help with him going to sleep and staying asleep more regularly.  Plan  Patient Instructions  Chest xray today  CPAP download .  Healthy sleep regimen .  Continue on CPAP At bedtime, wear each night all night long .  Work on healthy weight  Do not drive if sleepy  Continue on Advair Twice daily  , rinse after use Albuterol inhaler As needed   Follow up with Dr. Annamaria Boots  in 3 months and As needed  Please contact office for sooner follow up if symptoms do not improve or worsen or seek emergency care

## 2022-06-27 NOTE — Telephone Encounter (Signed)
Received a message from [employer] that the patient  called them stating that he has been wearing a XT device [id number] he was prescribed in December 2023, but he just received AT device [id number] in the mail.    After some investigation it was determined the 2nd monitor was ordered in error. I alerted [employer] and tried to call the patient. He is currently wearing a Zio XT monitor and can mail back the second monitor that he received. [employer] will also try to reach the patient to return 2nd device.

## 2022-07-01 ENCOUNTER — Ambulatory Visit
Admission: RE | Admit: 2022-07-01 | Discharge: 2022-07-01 | Disposition: A | Discharge status: Home / Self Care | Payer: No Typology Code available for payment source | Source: Ambulatory Visit | Attending: Family Medicine | Admitting: Family Medicine

## 2022-07-01 ENCOUNTER — Other Ambulatory Visit: Payer: Self-pay | Admitting: Family Medicine

## 2022-07-01 DIAGNOSIS — R29898 Other symptoms and signs involving the musculoskeletal system: Secondary | ICD-10-CM

## 2022-07-04 ENCOUNTER — Telehealth: Payer: Self-pay | Admitting: Adult Health

## 2022-07-04 NOTE — Telephone Encounter (Signed)
Okay that, let us know if he needs anything

## 2022-07-04 NOTE — Telephone Encounter (Signed)
Pt called the office stating that he has a Resmed 11 CPAP machine. Stated that at last visit, they found out that there were issues with machine.  Pt said that [employer] sent him a SIM Card which did not fit his machine. Pt got ahold of [employer] and they said that his model that he has does not need a SIM Card as it transmits via modem. Pt said he will need to take his machine to [employer] to see if they can figure out why his machine is not transmitting.

## 2022-07-08 ENCOUNTER — Ambulatory Visit (AMBULATORY_SURGERY_CENTER): Payer: No Typology Code available for payment source | Admitting: *Deleted

## 2022-07-08 VITALS — Ht 70.0 in | Wt 234.0 lb

## 2022-07-08 DIAGNOSIS — Z86010 Personal history of colonic polyps: Secondary | ICD-10-CM

## 2022-07-08 NOTE — Progress Notes (Signed)
No egg or soy allergy known to patient  No issues known to pt with past sedation with any surgeries or procedures Patient denies ever being told they had issues or difficulty with intubation  No FH KNOWN TO PT,only what was told to him,in orphan at 5,homeless at 19 per pt. Pt is not on diet pills Pt is not on  home 02  Pt is not on blood thinners  Pt denies issues with constipation  No A fib or A flutter Have any cardiac testing pending-no but have cardiology appt on 07/28/22. Pt instructed to use [employer] or GoodRx for a price reduction on prep    Sample sheet of over the counter items to purchase for prep sent with packet.  Patient's chart reviewed by Osvaldo Angst CNRA prior to previsit and patient appropriate for the [facility].  Previsit completed and red dot placed by patient's name on their procedure day (on provider's schedule).

## 2022-07-13 ENCOUNTER — Encounter: Payer: Self-pay | Admitting: Certified Registered Nurse Anesthetist

## 2022-07-14 NOTE — Progress Notes (Signed)
Referring-Peter Blomgren, MD Reason for referral-dyspnea  HPI: 72 year old male for evaluation of dyspnea at request of Derinda Late, MD.  Patient is followed by pulmonary for obstructive sleep apnea and asthma.  Does wear CPAP.  Carotid Dopplers March 2019 showed 1 to 39% bilateral stenosis.  Abdominal CT February 2023 showed no aneurysm.  Recently had monitor placed for dizziness.  Monitor December 2023 showed sinus rhythm with PACs, 1 episode of brief PAT and rare PVC.  Chest x-ray January 2024 showed no active cardiopulmonary disease.  Patient states that for the past 6 months he has noticed dyspnea on exertion which is new.  There is no orthopnea, PND, pedal edema, exertional chest pain or syncope.  He also states 1 day he was bending to remove close from a dryer.  He had difficulty standing.  However there is no claudication noted.  Cardiology now asked to evaluate.  Current Outpatient Medications  Medication Sig Dispense Refill   acetaminophen (TYLENOL) 325 MG tablet Take 500 mg by mouth every 6 (six) hours as needed for mild pain.     albuterol (PROAIR HFA) 108 (90 Base) MCG/ACT inhaler Inhale into the lungs every 6 (six) hours as needed for wheezing or shortness of breath.     anastrozole (ARIMIDEX) 1 MG tablet Take 1 mg by mouth daily.     Ascorbic Acid (VITAMIN C) 500 MG CAPS Take 1 tablet by mouth daily.     betamethasone dipropionate 0.05 % lotion Apply topically as needed.     buPROPion (WELLBUTRIN XL) 150 MG 24 hr tablet Take 150 mg by mouth every morning.     cetirizine (ZYRTEC) 10 MG tablet Take 10 mg by mouth daily.     Cholecalciferol (VITAMIN D-3) 1000 UNITS CAPS Take 2 capsules by mouth daily.     clonazePAM (KLONOPIN) 2 MG tablet Take 2 mg by mouth at bedtime as needed.  1   ezetimibe (ZETIA) 10 MG tablet Take 10 mg by mouth daily.     fluoruracil (CARAC) 0.5 % cream Apply 1 Application topically daily as needed. To possible skin cancers     fluticasone (FLONASE) 50  MCG/ACT nasal spray Place 1 spray into both nostrils as needed.     Fluticasone-Salmeterol (ADVAIR) 250-50 MCG/DOSE AEPB Inhale 1 puff into the lungs 2 (two) times daily as needed.     hydrocortisone 2.5 % cream Apply topically 2 (two) times daily.     rosuvastatin (CRESTOR) 40 MG tablet Take 40 mg by mouth daily.     SUMAtriptan (IMITREX) 100 MG tablet Take 100 mg by mouth as needed.     Testosterone (TESTOPEL) 75 MG PLLT Inject into the skin.     topiramate (TOPAMAX) 50 MG tablet Take 50 mg by mouth daily.     traZODone (DESYREL) 50 MG tablet Take 50 mg by mouth at bedtime.     eszopiclone (LUNESTA) 1 MG TABS tablet Take by mouth. (Patient not taking: Reported on 07/28/2022)     zolpidem (AMBIEN) 10 MG tablet Take by mouth. (Patient not taking: Reported on 07/28/2022)     No current facility-administered medications for this visit.    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   Hearing loss 2010   HSV-1 (herpes simplex virus 1) infection 1999   Hx of colonic polyp 08/20/2016   Hyperlipidemia    Impaired glucose tolerance    Insomnia    Lactose intolerance    Malaria    1974, 1975   Migraine    Multiple allergies    Nocturnal sleep-related eating disorder    Pneumonia 2006,2008   Rosacea    Sebaceous hyperplasia    Seborrheic dermatitis    Serrated polyp of colon 2018   Sleep apnea    CPAP    Vitamin D deficiency     Past Surgical History:  Procedure Laterality Date   COLONOSCOPY     VASECTOMY  1990   VOCAL CORD INJECTION     x 2   WISDOM TOOTH EXTRACTION  1974    Social History   Socioeconomic History   Marital status: Married    Spouse name: Not on file   Number of  children: 2   Years of education: Not on file   Highest education level: Not on file  Occupational History   Occupation: Sport and exercise psychologist: [employer]  Tobacco Use   Smoking status: Never   Smokeless tobacco: Never  Vaping Use   Vaping Use: Never used  Substance and Sexual Activity   Alcohol use: Yes    Alcohol/week: 1.0 standard drink of alcohol    Types: 1 Standard drinks or equivalent per week    Comment: Occasional   Drug use: No   Sexual activity: Not on file  Other Topics Concern   Not on file  Social History Narrative   Married - 2 children, wife is a Product/process development scientist firm and an Chief Strategy Officer   EtOH 4 drinks/year   Never smoker/drugs   Social Determinants of Radio broadcast assistant Strain: Not on Art therapist Insecurity: Not on file  Transportation Needs: Not on file  Physical Activity: Not on file  Stress: Not on file  Social Connections: Not on file  Intimate Partner Violence: Not on file    Family History  Problem Relation Age of Onset   Schizophrenia Mother        or bipolar disorder   Other Father        brain damage form suicide attempt   Depression Father    Alcohol abuse Father    Anxiety disorder Daughter    Panic disorder Daughter    OCD Daughter    Other Son        ALL in remission, aseptic necrosis of multiple joints   Colon cancer Neg Hx    Rectal cancer Neg Hx    Stomach cancer Neg Hx     ROS: no fevers or chills, productive cough, hemoptysis, dysphasia, odynophagia, melena, hematochezia, dysuria, hematuria, rash, seizure activity, orthopnea, PND, pedal edema, claudication. Remaining systems are negative.  Physical Exam:   Blood pressure 126/84, pulse 68, height '5\' 10"'$  (1.778 m), weight 239 lb (108.4 kg), SpO2 97 %.  General:  Well developed/well nourished in NAD Skin warm/dry Patient not depressed No peripheral clubbing Back-normal HEENT-normal/normal eyelids Neck supple/normal carotid upstroke  bilaterally; no bruits; no JVD; no thyromegaly chest - CTA/ normal expansion CV - RRR/normal S1 and S2; no murmurs, rubs or gallops;  PMI nondisplaced Abdomen -NT/ND, no HSM, no mass, + bowel sounds, no bruit 2+ femoral pulses,  no bruits Ext-no edema, chords, 2+ DP Neuro-grossly nonfocal  ECG -normal sinus rhythm with occasional PAC, no ST changes.  Personally reviewed  A/P  1 dyspnea-etiology unclear.  Cardiac CTA to rule out obstructive coronary disease.  Schedule echocardiogram to assess LV function.  2 hyperlipidemia-continue Crestor and Zetia.  3 obstructive sleep apnea-managed by pulmonary.  Continue CPAP.   Kirk Ruths, MD

## 2022-07-15 ENCOUNTER — Encounter: Payer: Self-pay | Admitting: Internal Medicine

## 2022-07-16 LAB — LAB REPORT - SCANNED
A1c: 5.6
EGFR: 57
PSA, Total: 0.883

## 2022-07-17 ENCOUNTER — Ambulatory Visit (AMBULATORY_SURGERY_CENTER): Payer: No Typology Code available for payment source | Admitting: Internal Medicine

## 2022-07-17 ENCOUNTER — Encounter: Payer: Self-pay | Admitting: Internal Medicine

## 2022-07-17 VITALS — BP 137/89 | HR 55 | Temp 98.0°F | Resp 16 | Ht 70.0 in | Wt 234.0 lb

## 2022-07-17 DIAGNOSIS — Z09 Encounter for follow-up examination after completed treatment for conditions other than malignant neoplasm: Secondary | ICD-10-CM

## 2022-07-17 DIAGNOSIS — D122 Benign neoplasm of ascending colon: Secondary | ICD-10-CM | POA: Diagnosis not present

## 2022-07-17 DIAGNOSIS — Z86010 Personal history of colonic polyps: Secondary | ICD-10-CM

## 2022-07-17 MED ORDER — SODIUM CHLORIDE 0.9 % IV SOLN: 500.0000 mL | Freq: Once | INTRAVENOUS | Status: DC

## 2022-07-17 NOTE — Patient Instructions (Addendum)
I found and removed one tiny polyp. I will let you know pathology results and when to have another routine colonoscopy by mail and/or My Chart.  I appreciate the opportunity to care for you. Gatha Mayer, MD, [facility]  Handouts provided on polyps and diverticulosis.  Resume previous diet.  Continue present medications.   YOU HAD AN ENDOSCOPIC PROCEDURE TODAY AT [facility]:   Refer to the procedure report that was given to you for any specific questions about what was found during the examination.  If the procedure report does not answer your questions, please call your gastroenterologist to clarify.  If you requested that your care partner not be given the details of your procedure findings, then the procedure report has been included in a sealed envelope for you to review at your convenience later.  YOU SHOULD EXPECT: Some feelings of bloating in the abdomen. Passage of more gas than usual.  Walking can help get rid of the air that was put into your GI tract during the procedure and reduce the bloating. If you had a lower endoscopy (such as a colonoscopy or flexible sigmoidoscopy) you may notice spotting of blood in your stool or on the toilet paper. If you underwent a bowel prep for your procedure, you may not have a normal bowel movement for a few days.  Please Note:  You might notice some irritation and congestion in your nose or some drainage.  This is from the oxygen used during your procedure.  There is no need for concern and it should clear up in a day or so.  SYMPTOMS TO REPORT IMMEDIATELY:  Following lower endoscopy (colonoscopy or flexible sigmoidoscopy):  Excessive amounts of blood in the stool  Significant tenderness or worsening of abdominal pains  Swelling of the abdomen that is new, acute  Fever of 100F or higher  For urgent or emergent issues, a gastroenterologist can be reached at any hour by calling [phone]. Do not use MyChart messaging for urgent  concerns.    DIET:  We do recommend a small meal at first, but then you may proceed to your regular diet.  Drink plenty of fluids but you should avoid alcoholic beverages for 24 hours.  ACTIVITY:  You should plan to take it easy for the rest of today and you should NOT DRIVE or use heavy machinery until tomorrow (because of the sedation medicines used during the test).    FOLLOW UP: Our staff will call the number listed on your records the next business day following your procedure.  We will call around 7:15- 8:00 am to check on you and address any questions or concerns that you may have regarding the information given to you following your procedure. If we do not reach you, we will leave a message.     If any biopsies were taken you will be contacted by phone or by letter within the next 1-3 weeks.  Please call us at [phone] if you have not heard about the biopsies in 3 weeks.    SIGNATURES/CONFIDENTIALITY: You and/or your care partner have signed paperwork which will be entered into your electronic medical record.  These signatures attest to the fact that that the information above on your After Visit Summary has been reviewed and is understood.  Full responsibility of the confidentiality of this discharge information lies with you and/or your care-partner.

## 2022-07-17 NOTE — Progress Notes (Signed)
[facility] History and Physical   Primary Care Physician:  Derinda Late, MD   Reason for Procedure:   Hx colon polyps  Plan:    colonoscopy     HPI: Jesus Ward is a 72 y.o. 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   Hearing loss 2010   HSV-1 (herpes simplex virus 1) infection 1999   Hx of colonic polyp 08/20/2016   Hyperlipidemia    IBS (irritable bowel syndrome)    Impaired glucose tolerance    Insomnia    Lactose intolerance    Malaria    1974, 1975   Migraine    Multiple allergies    Nocturnal sleep-related eating disorder    OSA (obstructive sleep apnea)    Pneumonia 2006,2008   Rosacea    Sebaceous hyperplasia    Seborrheic dermatitis    Serrated polyp of colon 2018   Sleep apnea    CPAP    Vitamin D deficiency     Past Surgical History:  Procedure Laterality Date   VASECTOMY  1990   VOCAL CORD INJECTION     x 2   WISDOM TOOTH EXTRACTION  1974    Prior to Admission medications   Medication Sig Start Date End Date Taking? Authorizing Provider  albuterol (PROAIR HFA) 108 (90 Base) MCG/ACT inhaler Inhale into the lungs every 6 (six) hours as needed for wheezing or shortness of breath.   Yes [provider]  anastrozole (ARIMIDEX) 1 MG tablet Take 1 mg by mouth daily. 06/11/22  Yes [provider]  Ascorbic Acid (VITAMIN C) 500 MG CAPS Take 1 tablet by mouth daily.   Yes [provider]  buPROPion (WELLBUTRIN XL) 150 MG 24 hr tablet Take 150 mg by mouth every morning. 06/18/22  Yes [provider]  cetirizine (ZYRTEC) 10 MG tablet Take 10 mg by mouth daily.   Yes [provider]  Cholecalciferol (VITAMIN D-3) 1000 UNITS CAPS Take 2 capsules by mouth daily.    Yes [provider]  clonazePAM (KLONOPIN) 2 MG tablet Take 2 mg by mouth at bedtime as needed. 06/05/16  Yes [provider]  eszopiclone (LUNESTA) 1 MG TABS tablet Take by mouth.   Yes [provider]  ezetimibe (ZETIA) 10 MG tablet Take 10 mg by mouth daily.   Yes [provider]  fluticasone (FLONASE) 50 MCG/ACT nasal spray Place 1 spray into both nostrils as needed. 03/31/22  Yes [provider]  Fluticasone-Salmeterol (ADVAIR) 250-50 MCG/DOSE AEPB Inhale 1 puff into the lungs 2 (two) times daily as needed.   Yes [provider]  rosuvastatin (CRESTOR) 40 MG tablet Take 40 mg by mouth daily.   Yes [provider]  Testosterone (TESTOPEL) 75 MG PLLT Inject into the skin.   Yes [provider]  traZODone (DESYREL) 50 MG tablet Take 50 mg by mouth at bedtime. 01/29/22  Yes [provider]  zolpidem (AMBIEN) 10 MG tablet Take by mouth. 01/14/09  Yes [provider]  acetaminophen (TYLENOL) 325 MG tablet Take 500 mg by mouth every 6 (six) hours as needed for mild pain. 07/04/21   [provider]  betamethasone dipropionate 0.05 %  lotion Apply topically as needed. Patient not taking: Reported on 07/17/2022    [provider]  fluoruracil [facility]) 0.5 % cream Apply 1 Application topically daily as needed. To possible skin cancers Patient not taking: Reported on 07/17/2022    [provider]  hydrocortisone 2.5 % cream Apply topically 2 (two) times daily. Patient not taking: Reported on 07/17/2022    [provider]  SUMAtriptan (IMITREX) 100 MG tablet Take 100 mg by mouth as needed. Patient not taking: Reported on 07/17/2022    [provider]    Current Outpatient Medications  Medication Sig Dispense Refill   albuterol (PROAIR HFA) 108 (90 Base) MCG/ACT inhaler Inhale into the lungs every 6 (six) hours as needed for wheezing or shortness of breath.     anastrozole  (ARIMIDEX) 1 MG tablet Take 1 mg by mouth daily.     Ascorbic Acid (VITAMIN C) 500 MG CAPS Take 1 tablet by mouth daily.     buPROPion (WELLBUTRIN XL) 150 MG 24 hr tablet Take 150 mg by mouth every morning.     cetirizine (ZYRTEC) 10 MG tablet Take 10 mg by mouth daily.     Cholecalciferol (VITAMIN D-3) 1000 UNITS CAPS Take 2 capsules by mouth daily.     clonazePAM (KLONOPIN) 2 MG tablet Take 2 mg by mouth at bedtime as needed.  1   eszopiclone (LUNESTA) 1 MG TABS tablet Take by mouth.     ezetimibe (ZETIA) 10 MG tablet Take 10 mg by mouth daily.     fluticasone (FLONASE) 50 MCG/ACT nasal spray Place 1 spray into both nostrils as needed.     Fluticasone-Salmeterol (ADVAIR) 250-50 MCG/DOSE AEPB Inhale 1 puff into the lungs 2 (two) times daily as needed.     rosuvastatin (CRESTOR) 40 MG tablet Take 40 mg by mouth daily.     Testosterone (TESTOPEL) 75 MG PLLT Inject into the skin.     traZODone (DESYREL) 50 MG tablet Take 50 mg by mouth at bedtime.     zolpidem (AMBIEN) 10 MG tablet Take by mouth.     acetaminophen (TYLENOL) 325 MG tablet Take 500 mg by mouth every 6 (six) hours as needed for mild pain.     betamethasone dipropionate 0.05 % lotion Apply topically as needed. (Patient not taking: Reported on 07/17/2022)     fluoruracil (CARAC) 0.5 % cream Apply 1 Application topically daily as needed. To possible skin cancers (Patient not taking: Reported on 07/17/2022)     hydrocortisone 2.5 % cream Apply topically 2 (two) times daily. (Patient not taking: Reported on 07/17/2022)     SUMAtriptan (IMITREX) 100 MG tablet Take 100 mg by mouth as needed. (Patient not taking: Reported on 07/17/2022)     Current Facility-Administered Medications  Medication Dose Route Frequency Provider Last Rate Last Admin   0.9 %  sodium chloride infusion  500 mL Intravenous Once Gatha Mayer, MD        Allergies as of 07/17/2022 - Review Complete 07/17/2022  Allergen Reaction Noted   Ceftin [cefuroxime axetil]  Nausea Only 04/26/2019   Penicillins  03/07/2011    Family History  Problem Relation Age of Onset   Schizophrenia Mother        or bipolar disorder   Other Father        brain damage form suicide attempt   Depression Father    Alcohol abuse Father    Anxiety disorder Daughter    Panic disorder Daughter    OCD Daughter  Other Son        ALL in remission, aseptic necrosis of multiple joints   Colon cancer Neg Hx    Rectal cancer Neg Hx    Stomach cancer Neg Hx     Social History   Socioeconomic History   Marital status: Married    Spouse name: Not on file   Number of children: 2   Years of education: Not on file   Highest education level: Not on file  Occupational History   Occupation: Sport and exercise psychologist: [employer]  Tobacco Use   Smoking status: Never   Smokeless tobacco: Never  Vaping Use   Vaping Use: Never used  Substance and Sexual Activity   Alcohol use: Yes    Alcohol/week: 1.0 standard drink of alcohol    Types: 1 Standard drinks or equivalent per week    Comment: 4x a year   Drug use: No   Sexual activity: Not on file  Other Topics Concern   Not on file  Social History Narrative   Married - 2 children, wife is a Product/process development scientist firm and an Chief Strategy Officer   EtOH 4 drinks/year   Never smoker/drugs   Social Determinants of Radio broadcast assistant Strain: Not on Art therapist Insecurity: Not on file  Transportation Needs: Not on file  Physical Activity: Not on file  Stress: Not on file  Social Connections: Not on file  Intimate Partner Violence: Not on file    Review of Systems:  All other review of systems negative except as mentioned in the HPI.  Physical Exam: Vital signs BP 131/70   Pulse 67   Temp 98 F (36.7 C) (Skin)   Ht '5\' 10"'$  (1.778 m)   Wt 234 lb (106.1 kg)   SpO2 96%   BMI 33.58 kg/m   General:   Alert,  Well-developed, well-nourished, pleasant and cooperative in NAD Lungs:  Clear throughout to  auscultation.   Heart:  Regular rate and rhythm; no murmurs, clicks, rubs,  or gallops. Abdomen:  Soft, nontender and nondistended. Normal bowel sounds.   Neuro/Psych:  Alert and cooperative. Normal mood and affect. A and O x 3   '@Kaysi Ourada'$  Simonne Maffucci, MD, [facility] [phone] (pager) 07/17/2022 9:50 AM@

## 2022-07-17 NOTE — Progress Notes (Signed)
Report given to PACU, vss 

## 2022-07-17 NOTE — Progress Notes (Signed)
Pt's states no medical or surgical changes since previsit or office visit. VS assessed by D.T

## 2022-07-17 NOTE — Op Note (Signed)
[facility] Patient Name: Jesus Ward Procedure Date: 07/17/2022 9:51 AM MRN: [id number] Endoscopist: Gatha Mayer , MD, [id number] Age: 72 Referring MD:  Date of Birth: September 23, 1950 Gender: Male Account #: [id number] Procedure:                Colonoscopy Indications:              High risk colon cancer surveillance: Personal                            history of sessile serrated colon polyp (less than                            10 mm in size) with no dysplasia, Last colonoscopy:                            2018 Medicines:                Monitored Anesthesia Care Procedure:                Pre-Anesthesia Assessment:                           - Prior to the procedure, a History and Physical                            was performed, and patient medications and                            allergies were reviewed. The patient's tolerance of                            previous anesthesia was also reviewed. The risks                            and benefits of the procedure and the sedation                            options and risks were discussed with the patient.                            All questions were answered, and informed consent                            was obtained. Prior Anticoagulants: The patient has                            taken no anticoagulant or antiplatelet agents. ASA                            Grade Assessment: II - A patient with mild systemic                            disease. After reviewing the risks and benefits,  the patient was deemed in satisfactory condition to                            undergo the procedure.                           After obtaining informed consent, the colonoscope                            was passed under direct vision. Throughout the                            procedure, the patient's blood pressure, pulse, and                            oxygen saturations were monitored continuously. The                             Colonoscope was introduced through the anus and                            advanced to the the cecum, identified by                            appendiceal orifice and ileocecal valve. The                            colonoscopy was performed without difficulty. The                            patient tolerated the procedure well. The quality                            of the bowel preparation was good. The ileocecal                            valve, appendiceal orifice, and rectum were                            photographed. The bowel preparation used was                            Miralax via split dose instruction. Scope In: 10:03:32 AM Scope Out: 10:22:53 AM Scope Withdrawal Time: 0 hours 16 minutes 42 seconds  Total Procedure Duration: 0 hours 19 minutes 21 seconds  Findings:                 The perianal and digital rectal examinations were                            normal.                           A diminutive polyp was found in the ascending  colon. The polyp was sessile. The polyp was removed                            with a cold snare. Resection and retrieval were                            complete. Verification of patient identification                            for the specimen was done. Estimated blood loss was                            minimal.                           Multiple diverticula were found in the sigmoid                            colon. There was narrowing of the colon in                            association with the diverticular opening.                           The exam was otherwise without abnormality on                            direct and retroflexion views. Complications:            No immediate complications. Estimated Blood Loss:     Estimated blood loss was minimal. Impression:               - One diminutive polyp in the ascending colon,                            removed with a cold snare.  Resected and retrieved.                           - Diverticulosis in the sigmoid colon. There was                            narrowing of the colon in association with the                            diverticular opening.                           - The examination was otherwise normal on direct                            and retroflexion views.                           - Personal history of colonic polyp 7 mm ssp 2018. Recommendation:           -  Patient has a contact number available for                            emergencies. The signs and symptoms of potential                            delayed complications were discussed with the                            patient. Return to normal activities tomorrow.                            Written discharge instructions were provided to the                            patient.                           - Resume previous diet.                           - Continue present medications.                           - Repeat colonoscopy is recommended for                            surveillance. The colonoscopy date will be                            determined after pathology results from today's                            exam become available for review. Gatha Mayer, MD 07/17/2022 10:30:07 AM This report has been signed electronically.

## 2022-07-18 ENCOUNTER — Telehealth: Payer: Self-pay | Admitting: *Deleted

## 2022-07-18 NOTE — Telephone Encounter (Signed)
  Follow up Call-     07/17/2022    8:45 AM  Call back number  Post procedure Call Back phone  # [phone]  Permission to leave phone message Yes     Patient questions:  Do you have a fever, pain , or abdominal swelling? No. Pain Score  0 *  Have you tolerated food without any problems? Yes.    Have you been able to return to your normal activities? Yes.    Do you have any questions about your discharge instructions: Diet   No. Medications  No. Follow up visit  No.  Do you have questions or concerns about your Care? No.  Actions: * If pain score is 4 or above: No action needed, pain <4.

## 2022-07-28 ENCOUNTER — Encounter: Payer: Self-pay | Admitting: Cardiology

## 2022-07-28 ENCOUNTER — Ambulatory Visit: Payer: No Typology Code available for payment source | Attending: Cardiology | Admitting: Cardiology

## 2022-07-28 VITALS — BP 126/84 | HR 68 | Ht 70.0 in | Wt 239.0 lb

## 2022-07-28 DIAGNOSIS — E7800 Pure hypercholesterolemia, unspecified: Secondary | ICD-10-CM

## 2022-07-28 DIAGNOSIS — R0609 Other forms of dyspnea: Secondary | ICD-10-CM

## 2022-07-28 DIAGNOSIS — G4733 Obstructive sleep apnea (adult) (pediatric): Secondary | ICD-10-CM

## 2022-07-28 MED ORDER — METOPROLOL TARTRATE 100 MG PO TABS: ORAL_TABLET | ORAL | 0 refills | Status: DC

## 2022-07-28 NOTE — Patient Instructions (Signed)
Testing/Procedures:  Your physician has requested that you have an echocardiogram. Echocardiography is a painless test that uses sound waves to create images of your heart. It provides your doctor with information about the size and shape of your heart and how well your heart's chambers and valves are working. This procedure takes approximately one hour. There are no restrictions for this procedure. Please do NOT wear cologne, perfume, aftershave, or lotions (deodorant is allowed). Please arrive 15 minutes prior to your appointment time. [place]     Your cardiac CT will be scheduled at   [facility] [place], [place] [address] [phone]   If scheduled at [facility], please arrive at the [facility] and Children's Entrance (Entrance C2) of [facility] 30 minutes prior to test start time. You can use the FREE valet parking offered at entrance C (encouraged to control the heart rate for the test)  Proceed to the [facility] Radiology Department (first floor) to check-in and test prep.  All radiology patients and guests should use entrance C2 at [facility], accessed from [facility], even though the hospital's physical address listed is [address].    If scheduled at [facility] or [facility], please arrive 15 mins early for check-in and test prep.   Please follow these instructions carefully (unless otherwise directed):  Hold all erectile dysfunction medications at least 3 days (72 hrs) prior to test. (Ie viagra, cialis, sildenafil, tadalafil, etc) We will administer nitroglycerin during this exam.   On the Night Before the Test: Be sure to Drink plenty of water. Do not consume any caffeinated/decaffeinated beverages or chocolate 12 hours prior to your test. Do not take any antihistamines 12 hours prior to your test.   On the Day of the  Test: Drink plenty of water until 1 hour prior to the test. Do not eat any food 1 hour prior to test. You may take your regular medications prior to the test.  Take metoprolol (Lopressor) 100 MG two hours prior to test.   After the Test: Drink plenty of water. After receiving IV contrast, you may experience a mild flushed feeling. This is normal. On occasion, you may experience a mild rash up to 24 hours after the test. This is not dangerous. If this occurs, you can take Benadryl 25 mg and increase your fluid intake. If you experience trouble breathing, this can be serious. If it is severe call 911 IMMEDIATELY. If it is mild, please call our office.   We will call to schedule your test 2-4 weeks out understanding that some insurance companies will need an authorization prior to the service being performed.   For non-scheduling related questions, please contact the cardiac imaging nurse navigator should you have any questions/concerns: Marchia Bond, Cardiac Imaging Nurse Navigator Gordy Clement, Cardiac Imaging Nurse Navigator [facility] Direct Office Dial: [phone]   For scheduling needs, including cancellations and rescheduling, please call [place], [phone].    Follow-Up: At [facility], you and your health needs are our priority.  As part of our continuing mission to provide you with exceptional heart care, we have created designated Provider Care Teams.  These Care Teams include your primary Cardiologist (physician) and Advanced Practice Providers (APPs -  Physician Assistants and Nurse Practitioners) who all work together to provide you with the care you need, when you need it.  We recommend signing  up for the patient portal called "MyChart".  Sign up information is provided on this After Visit Summary.  MyChart is used to connect with patients for Virtual Visits (Telemedicine).  Patients are able to view lab/test results, encounter  notes, upcoming appointments, etc.  Non-urgent messages can be sent to your provider as well.   To learn more about what you can do with MyChart, go to [email or web address].    Your next appointment:   12 month(s)  Provider:   Kirk Ruths MD

## 2022-07-30 ENCOUNTER — Encounter: Payer: Self-pay | Admitting: Cardiology

## 2022-07-30 ENCOUNTER — Encounter: Payer: Self-pay | Admitting: Internal Medicine

## 2022-07-30 NOTE — Telephone Encounter (Signed)
Error

## 2022-07-31 ENCOUNTER — Encounter: Payer: No Typology Code available for payment source | Attending: Psychology | Admitting: Psychology

## 2022-07-31 DIAGNOSIS — F5101 Primary insomnia: Secondary | ICD-10-CM | POA: Diagnosis present

## 2022-07-31 DIAGNOSIS — R4189 Other symptoms and signs involving cognitive functions and awareness: Secondary | ICD-10-CM | POA: Insufficient documentation

## 2022-07-31 DIAGNOSIS — G4733 Obstructive sleep apnea (adult) (pediatric): Secondary | ICD-10-CM | POA: Insufficient documentation

## 2022-08-01 ENCOUNTER — Telehealth: Payer: Self-pay | Admitting: Cardiology

## 2022-08-01 NOTE — Telephone Encounter (Signed)
Patient was calling to speak to the nurse. Please advise

## 2022-08-01 NOTE — Telephone Encounter (Signed)
Pt asked if he can do "light" exercising. Light walking on treadmill for 15 minutes and using dumbbells 20-50 pounds. Please advise.

## 2022-08-01 NOTE — Telephone Encounter (Signed)
Spoke with pt, Aware of dr crenshaw's recommendations.  °

## 2022-08-10 ENCOUNTER — Encounter: Payer: Self-pay | Admitting: Psychology

## 2022-08-10 NOTE — Progress Notes (Signed)
Neuropsychology Visit  Patient:  Jesus Ward   DOB: 10-May-1951  MR Number: [id number]  Location: [facility] [place], [place] [id number] [facility] [place] [address] Dept: [phone]  Date of Service: 07/31/2022  Start: 3 AM End: 4 PM  Duration of Service: 1 Hour  Provider/Observer:     Edgardo Roys PsyD  Chief Complaint:      Chief Complaint  Patient presents with   Memory Loss    Reason For Service:     Today I provided feedback regarding the recent neuropsychological evaluation with diagnostic impressions and recommendations going forward.  We went into depth with regard to recommendations specifically with the patient.  Have included a copy of the reason for service for this evaluation below as well as the impression/diagnosis and recommendations that can be found in its entirety in the patient's EMR dated 05/22/2022.  Jesus Ward is a 72 year old male referred for neuropsychological evaluation by his primary care provider Derinda Late, MD due to family's concern around the patient displaying increasing short-term memory issues.  The patient admits that he is in disagreement with them and does not feel like he is really having any changes in cognitive functioning and continues to effectively perform his demanding job duties efficiently but reports that he is "willing to have formal assessment" to address and answer this question.  While the patient's wife and family were not present for the clinical interview, the patient reports that his wife and 2 kids both think that he is having more short-term memory issues and he is willing to address this due to their concerns.  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  He is also now working  through issues related to planning for retirement, which have also caused him some personal distress.  The patient has a history of obstructive sleep apnea that was originally diagnosed in the 1990s and has been very diligent about CPAP use.  He continues with insomnia related to difficulty with sleep maintenance.  Patient with past medical history that includes obstructive sleep apnea with excellent compliance of CPAP, insomnia, asthma that is mild and intermittent, dysphonia and history of gastroesophageal reflux, and ANA and anti-smooth muscle weakly positive findings.   The patient reports that his wife and adult son and daughter both have been raising concerns about their observations suggesting short-term memory issues that the patient readily disagrees with and reports that he is not "concerned" with memory changes.  The patient denies any geographic disorientation or changes in visual spatial capacity, changes in executive functioning and reasoning problem-solving changes, or changes in attention or other cognitive functions.  While the patient has dysphonia he denies any word finding difficulties or verbal fluency changes.     One particular issue beyond the patient's sleep apnea, is his difficulty with maintenance of sleep.  The patient reports that he will sleep between 8 and 10 hours and on some occasions will sleep more than 10 hours.  Patient reports that his CPAP machine tells him that his blood oxygen levels will stay above 92.  The patient reports that he feels rested in the morning when he gets up.  The patient reports that even with medications for sleep including a 2 mg tablet of clonazepam at bedtime that he has difficulty sleeping  through the night.  As the patient reports that he does drink alcohol at times in the evening and the clonazepam and these may be playing some role in his difficulty with sleep maintenance.   The patient reports that he feels a lot of stress, particularly when  he first gets up in the morning.  The patient has a very demanding job with great responsibility for others financial wellbeing as he works as a Archivist as a Civil engineer, contracting a very Production manager.  The patient is currently taking both Klonopin clonazepam and and trazodone at night for sleep as well as sertraline to help with mood and mental acuity there are no other psychotropic medications.   The patient has indications of some autoimmune types of condition although they have been working through an assessment with these issues.  While he is being followed for this with concerns around liver function I was looking through his laboratory reports and was not able to find any lab work related to eosinophils or other indicators of excessive immune system responses beyond IgA studies and studies of his liver and the patient does have a history of treatment for asthmatic type symptoms.   The patient had a very traumatic early childhood.  The patient's mother suffered from significant psychiatric illness and essentially abandoned him very early in life.  His father attempted to commit suicide by shooting himself in the head and suffered severe brain damage and lived in an institution due to severe resulting cognitive deficits.  The patient was sent to an orphanage at an early age and then was homeless around 72 years of age.  The patient has had significant stressors as an adult with his son being diagnosed and treated for leukemia and while he is doing well now this has been a very stressful time in the sun has experienced significant medical complications from his treatment from leukemia.    Impression/Diagnosis:                       Overall, the results of the current neuropsychological evaluation are generally encouraging with regard to memory functions.  There does not appear to be significant memory deficits per se.  However, there  were some identified deficits that are likely to represent significant changes from premorbid functioning.  These primarily have to do with visual reason and problem-solving and information processing speed/focus execute attentional abilities.  I suspect that these changes in information processing speed are likely playing a primary role and the subjective/observational reports of his family.  The changes identified in visual reasoning and problem-solving are some more concerning findings, although these types of measures also have a time-based component to them and may further reflect information processing speed deficits than a primary deficit in visual reasoning and problem-solving.   As far as the agnostic considerations, this pattern is not particularly consistent with  progressive cortical dementia such as Alzheimer's or Lewy body type conditions.  They could however, represent some subcortical changes, although we have no brain imaging results from the past 6 years with his last brain MRI conducted on 12/11/20202017.  The pattern of cognitive strengths and weaknesses would be consistent with more extensive changes in white matter tracts from those found in 2017.  The types of cognitive deficits that were objectively assessed would clearly have an impact on the patient's efficiencies and learning new information in real world settings..  His symptoms are not  consistent with acute vascular event such as strokes or hemorrhagic bleeds and the patient's concerns have been  primarily around concerns of memory changes.  The patient does have a diagnosis of dysphonia with suggest the possibility of some underlying neurological changes, although there are lots of potential etiological factors around this diagnosis. The patient denies any significant change or disturbance in mood status and denies significant depression or anxiety(Note added on 07/31/2022:  Patient reported during feedback visit that after thought he  did feel like he is experencing some depressive symptoms and Dr. Sandi Mariscal has started Wellbutrin).  Patient is remaining active in both his occupational efforts as well as his outside work  Nurse, learning disability.   As far as treatment recommendations, it would be appropriate to perform repeat testing in 9 to 12 months to assess for any progressive changes as clear diagnostic considerations are not readily available from the recurrent results.  The patient does have a history of significant sleep disturbance with obstructive sleep apnea but this is being effectively managed with good compliance with CPAP.  The patient is taking  clonazepam at bedtime to help with onset of sleep, which could be playing a role in some of his memory issues as this is a potential side defect of such medications.  He would also be very important for the patient to make sure he is not having any alcohol at bedtime in particular as both alcohol and clonazepam can have a negative impact on quality of sleep even though they may aid in onset of sleep.    It may also be worthwhile looking at other strategies to help with his sleep pattern outside of benzodiazepine type medications.As always, good nutrition and regular physical activity will be important.  It may be worthwhile having a new MRI done to look for the possibility of microvascular ischemia manges and white matter tracts is that possibility cannot be ruled out with the current testing.   Also, review of depressive symptoms on 07/31/2022 suggest that he may also respond to an SSRI strategy as well.   I will sit down with the patient and go over the results of the current neuropsychological evaluation with recommendations and answer any questions he or his family may have.   Diagnosis:                                Subjective memory complaints   Obstructive sleep apnea   Primary insomnia     _____________________ Ilean Skill, Psy.D. Clinical Neuropsychologist

## 2022-08-20 ENCOUNTER — Ambulatory Visit (HOSPITAL_COMMUNITY): Payer: No Typology Code available for payment source | Attending: Cardiology

## 2022-08-20 DIAGNOSIS — R0609 Other forms of dyspnea: Secondary | ICD-10-CM | POA: Diagnosis not present

## 2022-08-20 LAB — ECHOCARDIOGRAM COMPLETE
Area-P 1/2: 2.91 cm2
S' Lateral: 3.3 cm

## 2022-08-22 ENCOUNTER — Telehealth (HOSPITAL_COMMUNITY): Payer: Self-pay | Admitting: *Deleted

## 2022-08-22 ENCOUNTER — Encounter (HOSPITAL_COMMUNITY): Payer: Self-pay

## 2022-08-22 ENCOUNTER — Other Ambulatory Visit (HOSPITAL_COMMUNITY): Payer: Self-pay | Admitting: *Deleted

## 2022-08-22 DIAGNOSIS — R0609 Other forms of dyspnea: Secondary | ICD-10-CM

## 2022-08-22 MED ORDER — METOPROLOL TARTRATE 100 MG PO TABS: ORAL_TABLET | ORAL | 0 refills | Status: DC

## 2022-08-22 NOTE — Telephone Encounter (Signed)
Patient returning call about his upcoming cardiac imaging study; pt verbalizes understanding of appt date/time, parking situation and where to check in, pre-test NPO status and medications ordered, and verified current allergies; name and call back number provided for further questions should they arise  Jesus Clement RN Navigator Cardiac Imaging [name] [phone] office [phone] cell  Patient to take '100mg'$  metoprolol tartrate two hours prior to his cardiac CT scan. He is aware to arrive at 11:30am.

## 2022-08-22 NOTE — Telephone Encounter (Signed)
Attempted to call patient regarding upcoming cardiac CT appointment. °Left message on voicemail with name and callback number ° °Darrel Baroni RN Navigator Cardiac Imaging °[facility] °[phone] Office °[phone] Cell ° °

## 2022-08-25 ENCOUNTER — Ambulatory Visit (HOSPITAL_COMMUNITY): Admission: RE | Admit: 2022-08-25 | Payer: No Typology Code available for payment source | Source: Ambulatory Visit

## 2022-08-28 ENCOUNTER — Encounter: Payer: Self-pay | Admitting: *Deleted

## 2022-09-03 ENCOUNTER — Encounter: Payer: Self-pay | Admitting: Cardiology

## 2022-09-03 ENCOUNTER — Telehealth (HOSPITAL_COMMUNITY): Payer: Self-pay | Admitting: Emergency Medicine

## 2022-09-03 DIAGNOSIS — R0609 Other forms of dyspnea: Secondary | ICD-10-CM

## 2022-09-03 MED ORDER — METOPROLOL TARTRATE 100 MG PO TABS: ORAL_TABLET | ORAL | 0 refills | Status: AC

## 2022-09-03 NOTE — Telephone Encounter (Signed)
Attempted to call patient regarding upcoming cardiac CT appointment. °Left message on voicemail with name and callback number °Kemontae Dunklee RN Navigator Cardiac Imaging °[facility] °[phone] Office °[phone] Cell ° °

## 2022-09-04 ENCOUNTER — Telehealth (HOSPITAL_COMMUNITY): Payer: Self-pay | Admitting: *Deleted

## 2022-09-04 NOTE — Telephone Encounter (Signed)
Patient returning call about his upcoming cardiac imaging study; pt verbalizes understanding of appt date/time, parking situation and where to check in, pre-test NPO status and medications ordered, and verified current allergies; name and call back number provided for further questions should they arise  Jesus Clement RN Navigator Cardiac Imaging [facility] [phone] office [phone] cell  Patient to take '100mg'$  metoprolol tartrate two hours prior to his cardiac CT scan.  He is aware to arrive at 7:30am.

## 2022-09-05 ENCOUNTER — Ambulatory Visit (HOSPITAL_COMMUNITY)
Admission: RE | Admit: 2022-09-05 | Discharge: 2022-09-05 | Disposition: A | Discharge status: Home / Self Care | Payer: No Typology Code available for payment source | Source: Ambulatory Visit | Attending: Cardiology | Admitting: Cardiology

## 2022-09-05 DIAGNOSIS — R931 Abnormal findings on diagnostic imaging of heart and coronary circulation: Secondary | ICD-10-CM | POA: Diagnosis not present

## 2022-09-05 DIAGNOSIS — R0609 Other forms of dyspnea: Secondary | ICD-10-CM | POA: Diagnosis present

## 2022-09-05 MED ORDER — NITROGLYCERIN 0.4 MG SL SUBL
0.8000 mg | SUBLINGUAL_TABLET | Freq: Once | SUBLINGUAL | Status: AC
  Administered 2022-09-05: 0.8 mg via SUBLINGUAL

## 2022-09-05 MED ORDER — IOHEXOL 350 MG/ML SOLN
100.0000 mL | Freq: Once | INTRAVENOUS | Status: AC | PRN
  Administered 2022-09-05: 100 mL via INTRAVENOUS

## 2022-09-06 ENCOUNTER — Encounter: Payer: Self-pay | Admitting: Cardiology

## 2022-11-04 ENCOUNTER — Other Ambulatory Visit: Payer: Self-pay | Admitting: Family Medicine

## 2022-11-04 DIAGNOSIS — R42 Dizziness and giddiness: Secondary | ICD-10-CM

## 2022-11-04 DIAGNOSIS — R413 Other amnesia: Secondary | ICD-10-CM

## 2022-11-10 ENCOUNTER — Ambulatory Visit
Admission: RE | Admit: 2022-11-10 | Discharge: 2022-11-10 | Disposition: A | Discharge status: Home / Self Care | Payer: No Typology Code available for payment source | Source: Ambulatory Visit | Attending: Family Medicine | Admitting: Family Medicine

## 2022-11-10 DIAGNOSIS — R42 Dizziness and giddiness: Secondary | ICD-10-CM

## 2022-11-10 DIAGNOSIS — R413 Other amnesia: Secondary | ICD-10-CM

## 2022-11-10 MED ORDER — GADOPICLENOL 0.5 MMOL/ML IV SOLN
10.0000 mL | Freq: Once | INTRAVENOUS | Status: AC | PRN
  Administered 2022-11-10: 10 mL via INTRAVENOUS

## 2023-03-04 NOTE — Progress Notes (Deleted)
HPI Male never smoker followed for OSA, insomnia, complicated by asthma, GERD, hyperlipidemia NPSG 03/27/94- AHI 53/ hr, weight 225 lbs, CPAP titrated to 10. PFT 11/11/19- WNL -------------------------------------------------------------------------------------------------  04/16/20- 69 yoM never smoker followed gfor OSA, Insomnia, complicated by Asthma, DOE chronic sinusitis, GERD, hyperlipidemia Albuterol hfa, Advair 250, clonazepam 2 mg hs, CPAP auto 5-20/ [employer] Download- compliance 90%, AHI 1/ hr Body weight today- 260 lbs Covid vax- 2 Moderna Flu vax-  Feeling quite well now, no acute concerns. Comfortable with CPAP. No longer much aware of DOE. PFT 11/11/19- WNL CXR 09/30/19-  Lungs clear.  Cardiac silhouette within normal limits.  03/05/23- 71 yoM never smoker followed gfor OSA, Insomnia, complicated by Asthma, DOE chronic sinusitis, GERD, hyperlipidemia, Depression, Dysphonia,  Albuterol hfa, Advair 250, clonazepam 2 mg hs, Trazodone, Sertraline, Wellbutrin, Ambien?, Lunesta?  CPAP auto 5-20/ [facility] Download- compliance  Body weight today-  Had assessment for concern of possible memory changes  CXR 06/25/22- IMPRESSION: No active cardiopulmonary disease.  ROS-see HPI  += positive Constitutional:   + weight gain,No- night sweats, fevers, chills, fatigue, lassitude. HEENT:   No-  headaches, difficulty swallowing, tooth/dental problems, sore throat,       No-  sneezing, itching, ear ache, nasal congestion, post nasal drip,  CV:  No-   chest pain, orthopnea, PND, swelling in lower extremities, anasarca,                                                    dizziness, palpitations Resp:   shortness of breath with exertion or at rest.              No-   productive cough,  No non-productive cough,  No- coughing up of blood.              No-   change in color of sputm, + wheezing.   Skin: No-   rash or lesions. GI:  No-   heartburn, indigestion, abdominal pain, nausea, vomiting,  diarrhea,                 change in bowel habits, loss of appetite GU: No-   dysuria, change in color of urine, no urgency or frequency.  No- flank pain. MS:  No-   joint pain or swelling.  No- decreased range of motion.  No- back pain. Neuro-     nothing unusual Psych:  No- change in mood or affect. No depression or anxiety.  No memory loss.  OBJ- Physical Exam General- Alert, Oriented, Affect-appropriate, Distress- none acute,  + overweight Skin- rash-none, lesions- none, excoriation- none Lymphadenopathy- none Head- atraumatic            Eyes- Gross vision intact, PERRLA, conjunctivae and secretions clear            Ears- Hearing, canals-normal            Nose- Clear, no-Septal dev, mucus, polyps, erosion, perforation             Throat- Mallampati III , mucosa clear , drainage- none, tonsils- atrophic Neck- flexible , trachea midline, no stridor , thyroid nl, carotid no bruit Chest - symmetrical excursion , unlabored           Heart/CV- RRR , no murmur , no gallop  , no rub, nl s1 s2                           -  JVD- none , edema- none, stasis changes- none, varices- none           Lung- clear to P&A, wheeze- none, cough- none , dullness-none, rub- none           Chest wall-  Abd- tender-no, distended-no, bowel sounds-present, HSM- no Br/ Gen/ Rectal- Not done, not indicated Extrem- cyanosis- none, clubbing, none, atrophy- none, strength- nl Neuro- grossly intact to observation

## 2023-03-06 ENCOUNTER — Ambulatory Visit: Payer: No Typology Code available for payment source | Admitting: Internal Medicine

## 2023-08-04 ENCOUNTER — Ambulatory Visit: Payer: No Typology Code available for payment source | Admitting: Adult Health

## 2023-08-04 ENCOUNTER — Encounter: Payer: Self-pay | Admitting: Adult Health

## 2023-08-04 VITALS — BP 134/93 | HR 65 | Ht 70.0 in | Wt 235.8 lb

## 2023-08-04 DIAGNOSIS — J4520 Mild intermittent asthma, uncomplicated: Secondary | ICD-10-CM

## 2023-08-04 DIAGNOSIS — G4733 Obstructive sleep apnea (adult) (pediatric): Secondary | ICD-10-CM

## 2023-08-04 NOTE — Progress Notes (Unsigned)
@Patient  ID: Jesus Ward, male    DOB: 10-31-1950, 73 y.o.   MRN: [id number]  Chief Complaint  Patient presents with   Follow-up    Referring provider: Mosetta Putt, MD  HPI: 73 yo male never smoker followed for OSA and Asthma  Medical history of significant for Chronic Sinusitis and GERD   TEST/EVENTS :  PFT 10/2019 Normal -FEV1 106%, ratio 80, FVC 98%, no BD response, DLCO 117%  03/27/1994 AHI 53/hr   08/04/2023 Follow up: OSA and Asthma  Patient returns for a 1 year follow-up.  Patient has longstanding severe obstructive sleep apnea remains on CPAP at bedtime.  Patient says he is doing well on CPAP.  Wears a CPAP every single night.  Typically gets on about 8 hours of usage.  He also has a [employer] travel mini that he uses when he travels.  Currently using a CPAP nasal mask.  Says he is benefiting from CPAP with decreased daytime sleepiness.  CPAP download shows excellent compliance with daily average usage at 8 hours.  Patient is on auto CPAP 5 to 20 cm H2O.  AHI 2.1/hour daily average pressure at 9.4 cm H2O.  Minimal mask leaks.  Patient has mild persistent asthma.  Says overall he is doing well with his breathing.  Denies any flare of cough or wheezing.  Currently is maintained on Advair twice daily.      Allergies  Allergen Reactions   Ceftin [Cefuroxime Axetil] Nausea Only   Penicillins     Unknown- had reaction as a child    Immunization History  Administered Date(s) Administered   Hepatitis A 12/07/2009, 06/21/2010   Hepatitis B 12/07/2009, 06/21/2010, 10/07/2010   Influenza Split 03/23/2013   Influenza Whole 06/22/2018   Moderna Sars-Covid-2 Vaccination 09/02/2019, 09/30/2019   Pneumococcal Conjugate-13 05/14/2016   Pneumococcal Polysaccharide-23 05/06/2013   Tdap 10/19/2017   Zoster Recombinant(Shingrix) 09/11/2017, 01/01/2018    Past Medical History:  Diagnosis Date   Allergic rhinitis    Allergy    SEASONAL   Asthma    Depression     Diverticulosis 2007   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   Hearing loss 2010   HSV-1 (herpes simplex virus 1) infection 1999   Hx of colonic polyp 08/20/2016   Hyperlipidemia    Impaired glucose tolerance    Insomnia    Lactose intolerance    Malaria    1974, 1975   Migraine    Multiple allergies    Nocturnal sleep-related eating disorder    Pneumonia 2006,2008   Rosacea    Sebaceous hyperplasia    Seborrheic dermatitis    Serrated polyp of colon 2018   Sleep apnea    CPAP    Vitamin D deficiency     Tobacco History: Social History   Tobacco Use  Smoking Status Never  Smokeless Tobacco Never   Counseling given: Not Answered   Outpatient Medications Prior to Visit  Medication Sig Dispense Refill   acetaminophen (TYLENOL) 325 MG tablet Take 500 mg by mouth every 6 (six) hours as needed for mild pain.     albuterol (PROAIR HFA) 108 (90 Base) MCG/ACT inhaler Inhale into the lungs every 6 (six) hours as needed for wheezing or shortness of breath.     anastrozole (ARIMIDEX) 1 MG tablet Take 1 mg by mouth daily.     Ascorbic Acid (VITAMIN C) 500 MG CAPS Take 1 tablet by mouth daily.  betamethasone dipropionate 0.05 % lotion Apply topically as needed.     cetirizine (ZYRTEC) 10 MG tablet Take 10 mg by mouth daily.     clonazePAM (KLONOPIN) 2 MG tablet Take 2 mg by mouth at bedtime as needed.  1   eszopiclone (LUNESTA) 1 MG TABS tablet Take by mouth.     ezetimibe (ZETIA) 10 MG tablet Take 10 mg by mouth daily.     fluoruracil (CARAC) 0.5 % cream Apply 1 Application topically daily as needed. To possible skin cancers     fluticasone (FLONASE) 50 MCG/ACT nasal spray Place 1 spray into both nostrils as needed.     Fluticasone-Salmeterol (ADVAIR) 250-50 MCG/DOSE AEPB Inhale 1 puff into the lungs 2 (two) times daily as needed.     hydrocortisone 2.5 % cream Apply topically 2 (two) times daily.     metoprolol tartrate (LOPRESSOR) 100 MG  tablet TAKE 2 HOURS PRIOR TO CT SCAN 1 tablet 0   rosuvastatin (CRESTOR) 40 MG tablet Take 40 mg by mouth daily.     SUMAtriptan (IMITREX) 100 MG tablet Take 100 mg by mouth as needed.     Testosterone (TESTOPEL) 75 MG PLLT Inject into the skin.     topiramate (TOPAMAX) 50 MG tablet Take 50 mg by mouth daily.     traZODone (DESYREL) 50 MG tablet Take 50 mg by mouth at bedtime.     zolpidem (AMBIEN) 10 MG tablet Take by mouth.     buPROPion (WELLBUTRIN XL) 150 MG 24 hr tablet Take 150 mg by mouth every morning. (Patient not taking: Reported on 08/04/2023)     Cholecalciferol (VITAMIN D-3) 1000 UNITS CAPS Take 2 capsules by mouth daily. (Patient not taking: Reported on 08/04/2023)     No facility-administered medications prior to visit.     Review of Systems:   Constitutional:   No  weight loss, night sweats,  Fevers, chills, fatigue, or  lassitude.  HEENT:   No headaches,  Difficulty swallowing,  Tooth/dental problems, or  Sore throat,                No sneezing, itching, ear ache, nasal congestion, post nasal drip,   CV:  No chest pain,  Orthopnea, PND, swelling in lower extremities, anasarca, dizziness, palpitations, syncope.   GI  No heartburn, indigestion, abdominal pain, nausea, vomiting, diarrhea, change in bowel habits, loss of appetite, bloody stools.   Resp: No shortness of breath with exertion or at rest.  No excess mucus, no productive cough,  No non-productive cough,  No coughing up of blood.  No change in color of mucus.  No wheezing.  No chest wall deformity  Skin: no rash or lesions.  GU: no dysuria, change in color of urine, no urgency or frequency.  No flank pain, no hematuria   MS:  No joint pain or swelling.  No decreased range of motion.  No back pain.    Physical Exam  BP (!) 134/93 (BP Location: Left Arm, Patient Position: Sitting, Cuff Size: Large)   Pulse 65   Ht 5\' 10"  (1.778 m)   Wt 235 lb 12.8 oz (107 kg)   SpO2 97%   BMI 33.83 kg/m   GEN: A/Ox3;  pleasant , NAD, well nourished    HEENT:  [place]/AT,   NOSE-clear, THROAT-clear, no lesions, no postnasal drip or exudate noted.   NECK:  Supple w/ fair ROM; no JVD; normal carotid impulses w/o bruits; no thyromegaly or nodules palpated; no lymphadenopathy.  RESP  Clear  P & A; w/o, wheezes/ rales/ or rhonchi. no accessory muscle use, no dullness to percussion  CARD:  RRR, no m/r/g, no peripheral edema, pulses intact, no cyanosis or clubbing.  GI:   Soft & nt; nml bowel sounds; no organomegaly or masses detected.   Musco: Warm bil, no deformities or joint swelling noted.   Neuro: alert, no focal deficits noted.    Skin: Warm, no lesions or rashes    Lab Results:  CBC No results found for: "WBC", "RBC", "HGB", "HCT", "PLT", "MCV", "MCH", "MCHC", "RDW", "LYMPHSABS", "MONOABS", "EOSABS", "BASOSABS"  BMET No results found for: "NA", "K", "CL", "CO2", "GLUCOSE", "BUN", "CREATININE", "CALCIUM", "GFRNONAA", "GFRAA"  BNP No results found for: "BNP"  ProBNP No results found for: "PROBNP"  Imaging: No results found.  Administration History     None          Latest Ref Rng & Units 11/11/2019   11:53 AM  PFT Results  FVC-Pre L 4.25   FVC-Predicted Pre % 92   FVC-Post L 4.50   FVC-Predicted Post % 98   Pre FEV1/FVC % % 80   Post FEV1/FCV % % 80   FEV1-Pre L 3.39   FEV1-Predicted Pre % 99   FEV1-Post L 3.61   DLCO uncorrected ml/min/mmHg 31.35   DLCO UNC% % 117   DLCO corrected ml/min/mmHg 31.35   DLCO COR %Predicted % 117   DLVA Predicted % 122   TLC L 6.55   TLC % Predicted % 93   RV % Predicted % 94     No results found for: "NITRICOXIDE"      Assessment & Plan:   No problem-specific Assessment & Plan notes found for this encounter.     Rubye Oaks, NP 08/04/2023

## 2023-08-04 NOTE — Patient Instructions (Addendum)
Continue on CPAP At bedtime, wear each night all night long .  Work on healthy weight  Do not drive if sleepy   Continue on Advair Twice daily  , rinse after use Albuterol inhaler As needed   Follow up with Dr. Maple Hudson or Calin Fantroy NP in 6 months and As needed  Please contact office for sooner follow up if symptoms do not improve or worsen or seek emergency care

## 2023-08-05 ENCOUNTER — Telehealth: Payer: Self-pay | Admitting: Adult Health

## 2023-08-05 NOTE — Assessment & Plan Note (Signed)
Healthy weight loss discussed

## 2023-08-05 NOTE — Telephone Encounter (Signed)
Brad From [employer] for [phone]) DME order, "Order has been received. However, We are in need of OV face 2 face notes showing usage and benefit of the pap unit to process this request.  MD can amend her last note or patient may need OV. "

## 2023-08-05 NOTE — Assessment & Plan Note (Signed)
Excellent control and compliance on nocturnal CPAP.  Continue on current settings.  Plan  Patient Instructions  Continue on CPAP At bedtime, wear each night all night long .  Work on healthy weight  Do not drive if sleepy   Continue on Advair Twice daily  , rinse after use Albuterol inhaler As needed   Follow up with Dr. Maple Hudson or Annamae Shivley NP in 6 months and As needed  Please contact office for sooner follow up if symptoms do not improve or worsen or seek emergency care

## 2023-08-05 NOTE — Assessment & Plan Note (Signed)
Mild persistent asthma currently well-controlled.  Continue on current regimen.  Asthma action plan discussed

## 2023-08-06 NOTE — Telephone Encounter (Signed)
Please ask DME to provide [facility] and Insurance documentation requiring that patient have 2 office visit in order to get CPAP supplies patient was seen on August 04, 2023 with a office note documenting excellent compliance perceived benefit and effective control on CPAP device what more is needed in this documentation for the patient to get the appropriate device and supplies that he has been on for more than 20 years. He was also seen 1 year prior with an office visit recommendation that was face-to-face on June 25, 2022 with documentation of CPAP usage benefit and control.  Message has been routed to Dr. Maple Hudson,  patient care coordinator and our sleep providers for FYI for any additional guidance on the subject matter

## 2023-08-06 NOTE — Telephone Encounter (Signed)
DME needed a face "to" face ov, not 2 office visits. Should be ok just ammending last ov note to indicate patient uses and benefits from CPAP.

## 2023-08-06 NOTE — Telephone Encounter (Signed)
I do not need to addend note it says it in note from 08/04/2023    "08/04/2023 Follow up: OSA and Asthma  Patient returns for a 1 year follow-up.  Patient has longstanding severe obstructive sleep apnea remains on CPAP at bedtime.  Patient says he is doing well on CPAP.  Wears a CPAP every single night.  Typically gets on about 8 hours of usage.  He also has a [employer] travel mini that he uses when he travels.  Currently using a CPAP nasal mask.  Says he is benefiting from CPAP with decreased daytime sleepiness.  CPAP download shows excellent compliance with daily average usage at 8 hours.  Patient is on auto CPAP 5 to 20 cm H2O.  AHI 2.1/hour daily average pressure at 9.4 cm H2O.  Minimal mask leaks.""   My reply is what more does [employer] need. What are the new rules that require more documentation for a patient on CPAP .

## 2023-08-10 NOTE — Telephone Encounter (Signed)
 Forwarding to [facility] to get to DME ([employer]).  [facility], please clear this up with [employer].  Thank you.

## 2023-08-10 NOTE — Telephone Encounter (Signed)
 I sent this over to [facility] with [employer]--hopefully, this clears things up, and we get more information. I'll update the telephone encounter as soon as I receive a response.

## 2023-08-11 NOTE — Telephone Encounter (Signed)
 He originally requested the note at 9:30am at 08/05/23. At the time of his follow-up at 9:30 AM, the note was not in the account. However, it was signed and released later that day at 5:01 PM. Which is why there's probably some confusion. The Referral is good and nothing further is needed. Thank you.

## 2023-10-11 IMAGING — CT CT ABD-PELV W/ CM
1 of 3 series · 11 of 32 positions shown, 17 images · IV contrast (agent unspecified)
Comparison: None.

CLINICAL DATA: Abdominal wall mass of periumbilical region No
cancer Sx-extracting belly fat

EXAM:
CT ABDOMEN AND PELVIS WITH CONTRAST
TECHNIQUE: Multidetector CT imaging of the abdomen and pelvis was performed
using the standard protocol following bolus administration of
intravenous contrast.

[Series 2: abd/pelvis w/cm · axial · 0.95mm/px · z∈[+604,+1044]mm · 11 of 106 slices shown, 17 images]
[im 9/106  soft-tissue]
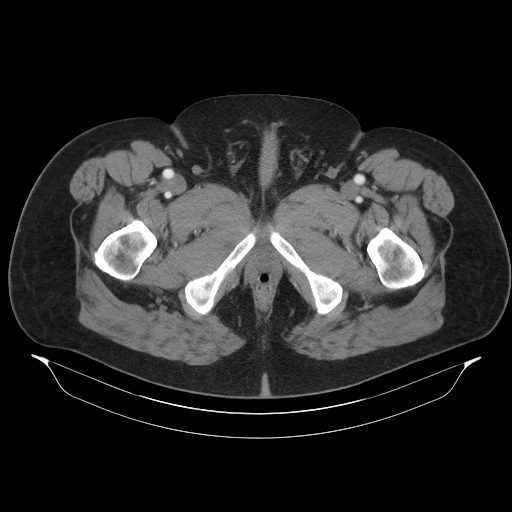
[im 9/106  bone]
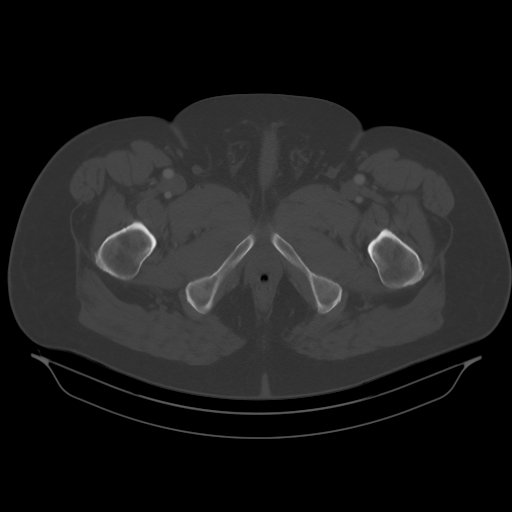
[im 18/106  soft-tissue]
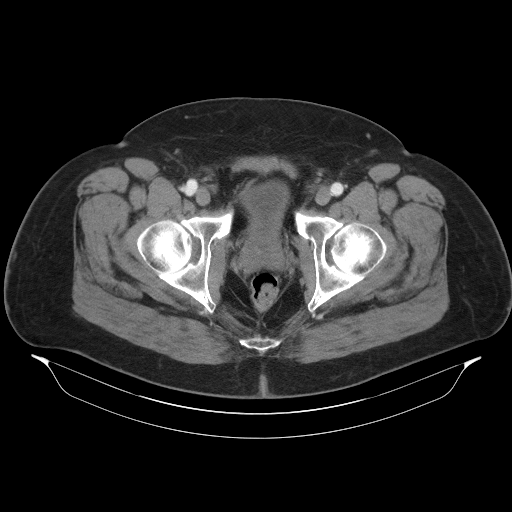
[im 27/106  soft-tissue]
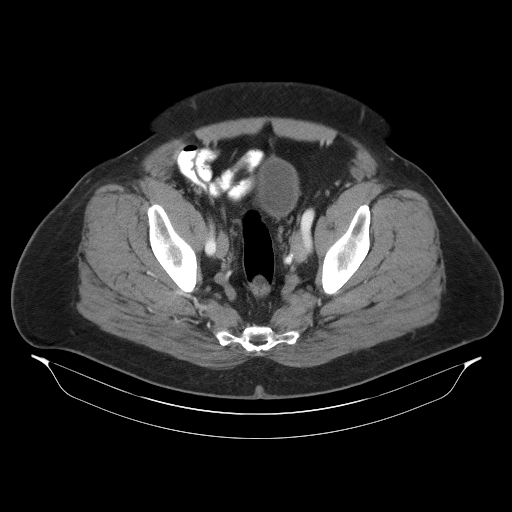
[im 36/106  soft-tissue]
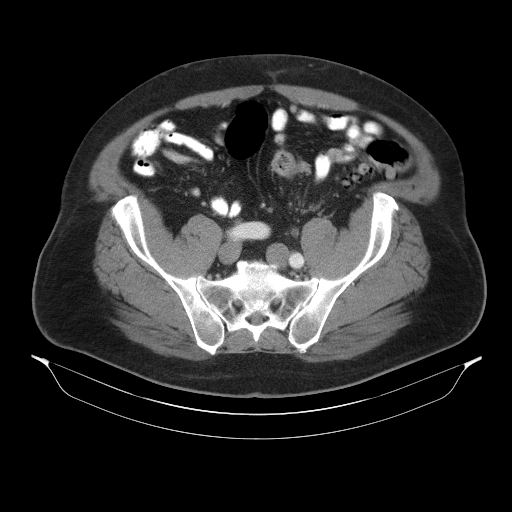
[im 44/106  soft-tissue]
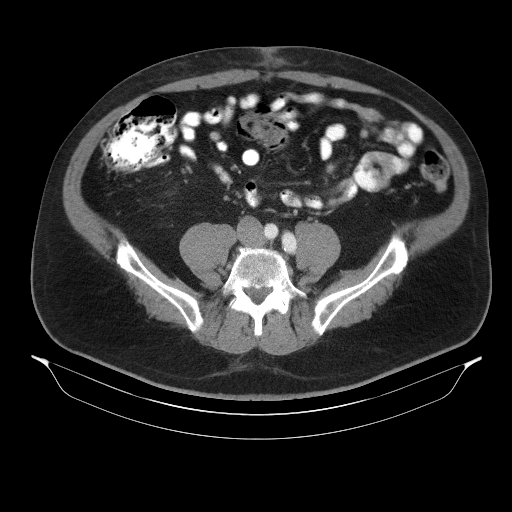
[im 53/106  soft-tissue]
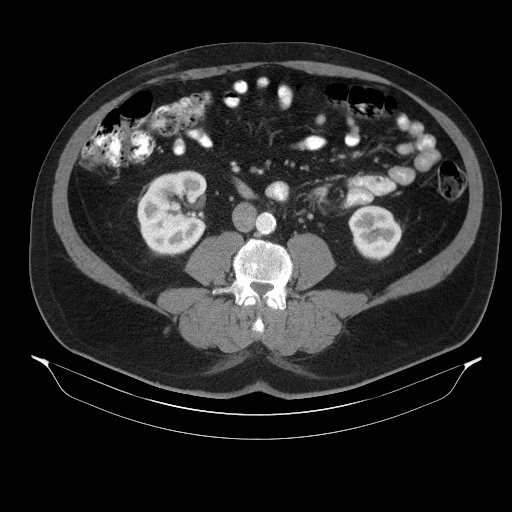
[im 62/106  soft-tissue]
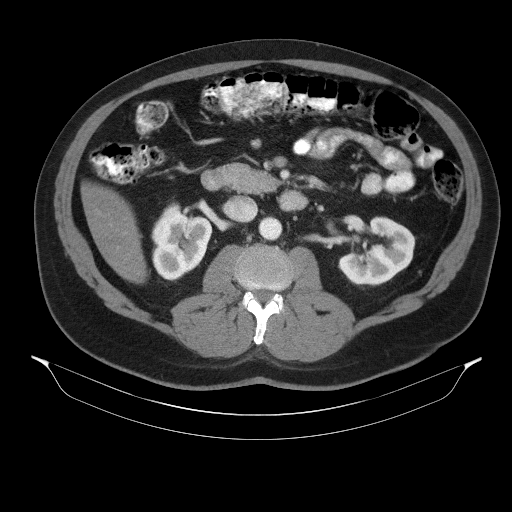
[im 71/106  soft-tissue]
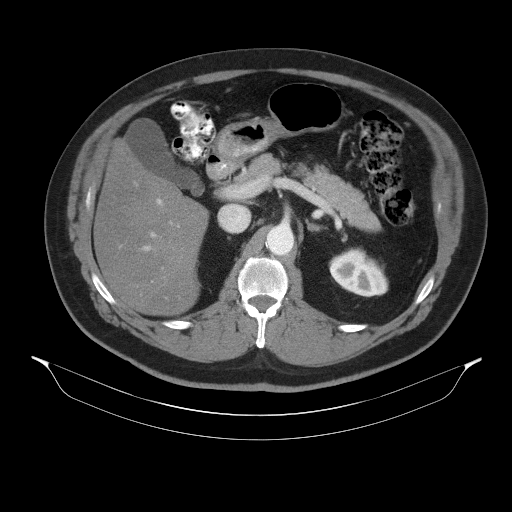
[im 71/106  lung]
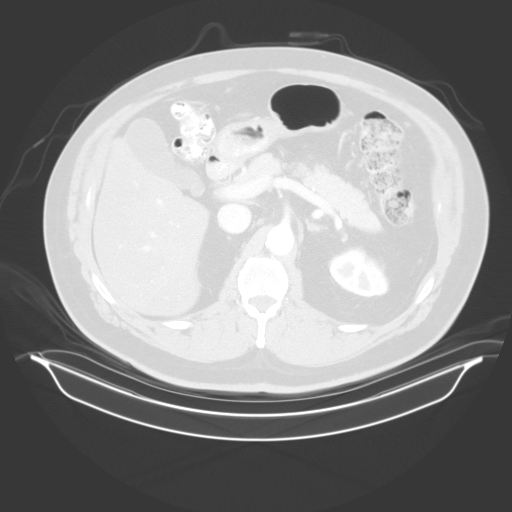
[im 79/106  soft-tissue]
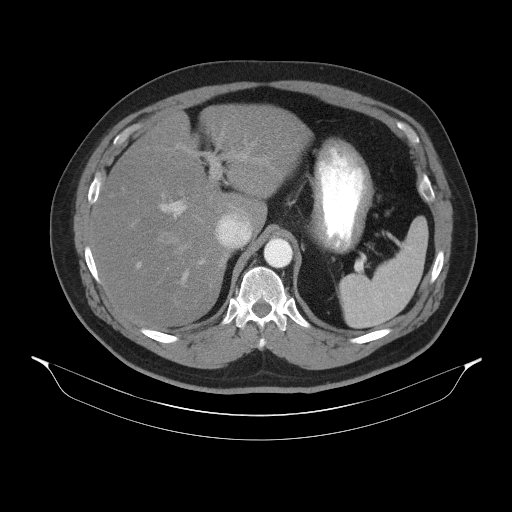
[im 79/106  lung]
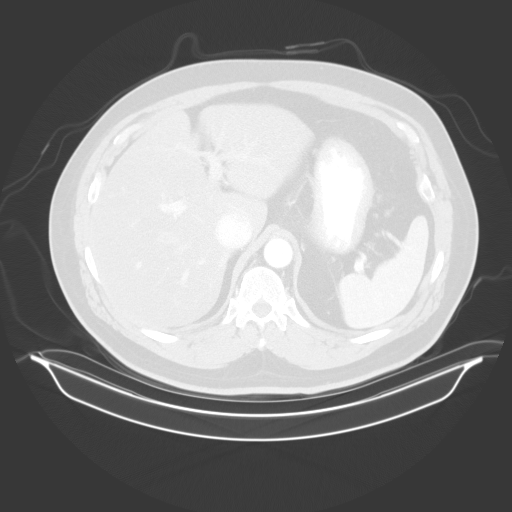
[im 79/106  bone]
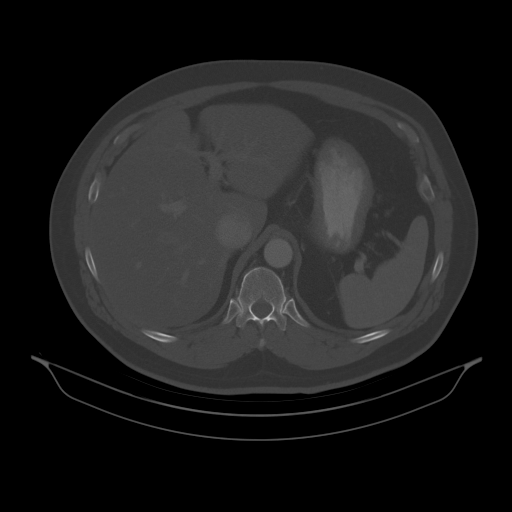
[im 88/106  soft-tissue]
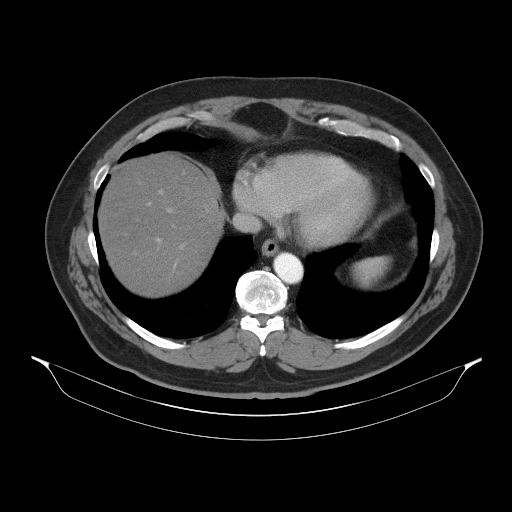
[im 88/106  lung]
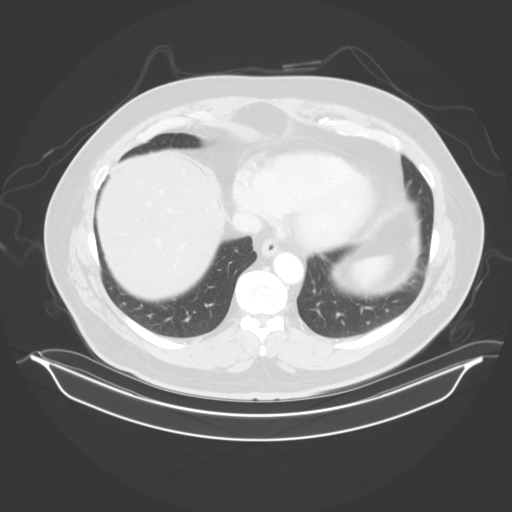
[im 97/106  soft-tissue]
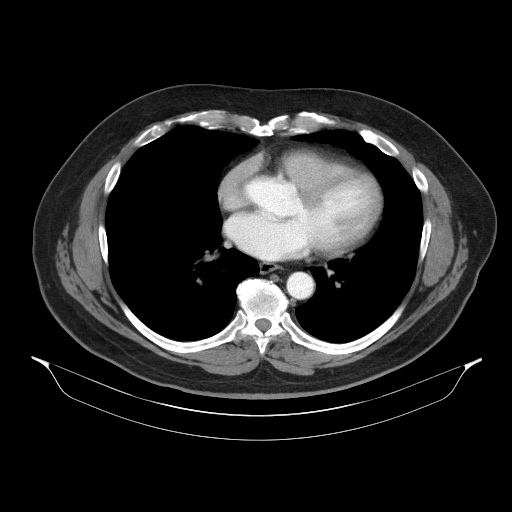
[im 97/106  lung]
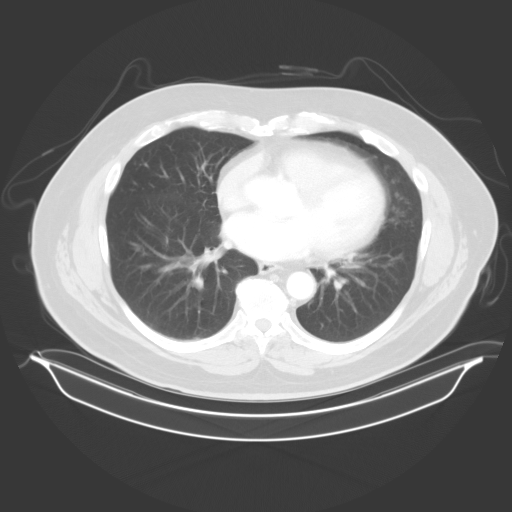

[11 of 32 positions shown; findings below may reference images not displayed]

RADIATION DOSE REDUCTION: This exam was performed according to the
departmental dose-optimization program which includes automated
exposure control, adjustment of the mA and/or kV according to
patient size and/or use of iterative reconstruction technique.

CONTRAST:  100mL 0DEP1K-VGG IOPAMIDOL (0DEP1K-VGG) INJECTION 61%
FINDINGS: Lower chest: No acute abnormality.

Hepatobiliary: The hepatic parenchyma is diffusely hypodense
compared to the splenic parenchyma consistent with fatty
infiltration. No focal liver abnormality. No gallstones, gallbladder
wall thickening, or pericholecystic fluid. No biliary dilatation.

Pancreas: No focal lesion. Normal pancreatic contour. No surrounding
inflammatory changes. No main pancreatic ductal dilatation.

Spleen: Normal in size without focal abnormality.

Adrenals/Urinary Tract:

No adrenal nodule bilaterally.

Bilateral kidneys enhance symmetrically. There is a 2.8 cm fluid
density lesion within the right kidney that likely represents a
simple renal cyst.

No hydronephrosis. No hydroureter.

The urinary bladder is unremarkable.

Stomach/Bowel: Stomach is within normal limits. No evidence of bowel
wall thickening or dilatation. Colonic diverticulosis. Appendix
appears normal.

Vascular/Lymphatic: No abdominal aorta or iliac aneurysm. Mild to
moderate atherosclerotic plaque of the aorta and its branches. No
abdominal, pelvic, or inguinal lymphadenopathy.

Reproductive: Prostate is unremarkable.

Other: No intraperitoneal free fluid. No intraperitoneal free gas.
No organized fluid collection.

Musculoskeletal:

Trace nonspecific periumbilical subcutaneus soft tissue fat
stranding.

No suspicious lytic or blastic osseous lesions. No acute displaced
fracture.
IMPRESSION: 1. Trace nonspecific periumbilical subcutaneus soft tissue fat
stranding.
2. Hepatic steatosis.
3. Colonic diverticulosis with no acute diverticulitis.
4.  Aortic Atherosclerosis ([id number]).

## 2023-10-23 ENCOUNTER — Other Ambulatory Visit: Payer: Self-pay | Admitting: Family Medicine

## 2023-10-23 DIAGNOSIS — R1084 Generalized abdominal pain: Secondary | ICD-10-CM

## 2023-10-23 DIAGNOSIS — R140 Abdominal distension (gaseous): Secondary | ICD-10-CM

## 2023-10-23 DIAGNOSIS — I7121 Aneurysm of the ascending aorta, without rupture: Secondary | ICD-10-CM

## 2023-10-28 ENCOUNTER — Telehealth: Payer: Self-pay | Admitting: Adult Health

## 2023-10-28 NOTE — Telephone Encounter (Signed)
 Spoke with Jesus Ward in regards to his CPAP supplies he seemed frustrated saying that he has not received supplies from [employer]. States he went to the [employer] location on [address] but he wasn't able to get anything because they don't supply to the public(they used to.) I called Brad at [employer] and he stated he has received supplies 7/28,9/14 and 2/25. He said he would put in an urgent order to his resupply team to reach put to Jesus Ward to figure out what exactly is going on and when can receive new supplies. I reached back out to Jesus Ward and told him he should be hearing from [employer] within the next 24 hour. Patient was understanding, stated he just wanted a human being to look at the machine and the filter as he states that Tammy took out the filter and said it was too dirty for him to be using. Patient is aware [employer] will be reaching out and I did give him the number where he could speak with an [employer] representative. Patient was understanding and waiting to hear from [employer].

## 2023-11-06 NOTE — Telephone Encounter (Signed)
 Spoke with Rodman Clam at [employer] he was reaching out to [facility] from the resupply team in regards to what was going on with the supplies. He stated they should be reaching out to him today to go over his account and what was going on. Arnetta Lank he would also keep me informed. I called and spoke with Jesus Ward and told him and he was understanding. I did give him a number to contact [employer] to speak to someone. Patient was understanding. NFN

## 2023-11-06 NOTE — Telephone Encounter (Addendum)
 Copied from CRM [id number]. Topic: General - Other >> Nov 04, 2023  9:43 AM Ambrose Junk wrote: Reason for CRM:  Patient called in requesting to talk to [facility]. Stated he spoke with you a week ago. Patient has not had a call back regarding CPAP supplies since he spoke with you and would like to talk to you.  Please advise

## 2023-11-11 ENCOUNTER — Ambulatory Visit
Admission: RE | Admit: 2023-11-11 | Discharge: 2023-11-11 | Disposition: A | Discharge status: Home / Self Care | Source: Ambulatory Visit | Attending: Family Medicine | Admitting: Family Medicine

## 2023-11-11 DIAGNOSIS — I7121 Aneurysm of the ascending aorta, without rupture: Secondary | ICD-10-CM

## 2023-11-11 DIAGNOSIS — R140 Abdominal distension (gaseous): Secondary | ICD-10-CM

## 2023-11-11 DIAGNOSIS — R1084 Generalized abdominal pain: Secondary | ICD-10-CM

## 2023-11-11 MED ORDER — IOPAMIDOL (ISOVUE-370) INJECTION 76%
100.0000 mL | Freq: Once | INTRAVENOUS | Status: AC | PRN
  Administered 2023-11-11: 100 mL via INTRAVENOUS
